# Patient Record
Sex: Female | Born: 1955 | Race: White | Hispanic: No | Smoking: Current every day smoker
Health system: Southern US, Community
[De-identification: ages and names within clinical notes are randomized; demographics above are authoritative.]

## PROBLEM LIST (undated history)

## (undated) DIAGNOSIS — G8929 Other chronic pain: Secondary | ICD-10-CM

## (undated) DIAGNOSIS — M1990 Unspecified osteoarthritis, unspecified site: Secondary | ICD-10-CM

## (undated) DIAGNOSIS — Z8679 Personal history of other diseases of the circulatory system: Secondary | ICD-10-CM

## (undated) DIAGNOSIS — F419 Anxiety disorder, unspecified: Secondary | ICD-10-CM

## (undated) DIAGNOSIS — M549 Dorsalgia, unspecified: Secondary | ICD-10-CM

## (undated) DIAGNOSIS — M25519 Pain in unspecified shoulder: Secondary | ICD-10-CM

## (undated) DIAGNOSIS — K219 Gastro-esophageal reflux disease without esophagitis: Secondary | ICD-10-CM

## (undated) DIAGNOSIS — M25561 Pain in right knee: Secondary | ICD-10-CM

## (undated) DIAGNOSIS — D126 Benign neoplasm of colon, unspecified: Secondary | ICD-10-CM

## (undated) DIAGNOSIS — K2970 Gastritis, unspecified, without bleeding: Secondary | ICD-10-CM

## (undated) DIAGNOSIS — Z87442 Personal history of urinary calculi: Secondary | ICD-10-CM

## (undated) DIAGNOSIS — M25562 Pain in left knee: Secondary | ICD-10-CM

## (undated) DIAGNOSIS — G629 Polyneuropathy, unspecified: Secondary | ICD-10-CM

## (undated) DIAGNOSIS — M75102 Unspecified rotator cuff tear or rupture of left shoulder, not specified as traumatic: Secondary | ICD-10-CM

## (undated) DIAGNOSIS — B9681 Helicobacter pylori [H. pylori] as the cause of diseases classified elsewhere: Secondary | ICD-10-CM

## (undated) HISTORY — PX: ABDOMINAL HYSTERECTOMY: SHX81

## (undated) HISTORY — PX: TUBAL LIGATION: SHX77

## (undated) HISTORY — DX: Anxiety disorder, unspecified: F41.9

---

## 2012-06-08 ENCOUNTER — Encounter (HOSPITAL_COMMUNITY): Payer: Self-pay | Admitting: *Deleted

## 2012-06-08 ENCOUNTER — Emergency Department (HOSPITAL_COMMUNITY)
Admission: EM | Admit: 2012-06-08 | Discharge: 2012-06-08 | Disposition: A | Discharge status: Home / Self Care | Payer: Self-pay | Attending: Emergency Medicine | Admitting: Emergency Medicine

## 2012-06-08 ENCOUNTER — Emergency Department (HOSPITAL_COMMUNITY): Payer: Self-pay

## 2012-06-08 DIAGNOSIS — Y939 Activity, unspecified: Secondary | ICD-10-CM | POA: Insufficient documentation

## 2012-06-08 DIAGNOSIS — S99919A Unspecified injury of unspecified ankle, initial encounter: Secondary | ICD-10-CM | POA: Insufficient documentation

## 2012-06-08 DIAGNOSIS — F17200 Nicotine dependence, unspecified, uncomplicated: Secondary | ICD-10-CM | POA: Insufficient documentation

## 2012-06-08 DIAGNOSIS — Z79899 Other long term (current) drug therapy: Secondary | ICD-10-CM | POA: Insufficient documentation

## 2012-06-08 DIAGNOSIS — W010XXA Fall on same level from slipping, tripping and stumbling without subsequent striking against object, initial encounter: Secondary | ICD-10-CM | POA: Insufficient documentation

## 2012-06-08 DIAGNOSIS — M25562 Pain in left knee: Secondary | ICD-10-CM

## 2012-06-08 DIAGNOSIS — S8990XA Unspecified injury of unspecified lower leg, initial encounter: Secondary | ICD-10-CM | POA: Insufficient documentation

## 2012-06-08 DIAGNOSIS — Y92009 Unspecified place in unspecified non-institutional (private) residence as the place of occurrence of the external cause: Secondary | ICD-10-CM | POA: Insufficient documentation

## 2012-06-08 MED ORDER — NAPROXEN 500 MG PO TABS: 500.0000 mg | ORAL_TABLET | Freq: Two times a day (BID) | ORAL | Status: DC

## 2012-06-08 MED ORDER — HYDROCODONE-ACETAMINOPHEN 5-325 MG PO TABS: ORAL_TABLET | ORAL | Status: DC

## 2012-06-08 MED ORDER — IBUPROFEN 800 MG PO TABS
800.0000 mg | ORAL_TABLET | Freq: Once | ORAL | Status: AC
  Administered 2012-06-08: 800 mg via ORAL
  Filled 2012-06-08: qty 1

## 2012-06-08 MED ORDER — OXYCODONE-ACETAMINOPHEN 5-325 MG PO TABS
1.0000 | ORAL_TABLET | Freq: Once | ORAL | Status: AC
  Administered 2012-06-08: 1 via ORAL
  Filled 2012-06-08: qty 1

## 2012-06-08 NOTE — ED Notes (Signed)
Pt fell on living room floor (carpet) after tripping over mattress on December 30, still continues to have pain to left knee area, knee is swollen, tender to touch, has abrasion (black scab on top) to center of knee. Cms intact distal

## 2012-06-08 NOTE — ED Provider Notes (Signed)
History     CSN: [id number]  Arrival date & time 06/08/12  1534   First MD Initiated Contact with Patient 06/08/12 1740      Chief Complaint  Patient presents with  . Knee Pain    (Consider location/radiation/quality/duration/timing/severity/associated sxs/prior treatment) HPI Comments: Patient complains of pain and swelling to her left knee for 2 weeks. She states the symptoms began after she fell in her home and landed on her knee. She states that her knee began to swell immediately and has persisted and is very painful to touch. She states the pain to her knee is worse with flexion and improves with rest. She denies numbness of the extremity, redness, draining, red streaks, or distal tenderness.  She states that she has not been evaluated for the injury prior to tonight due to to a lack of finances .  Patient is a 57 y.o. female presenting with knee pain. The history is provided by the patient.  Knee Pain This is a new problem. The current episode started 1 to 4 weeks ago. The problem occurs constantly. The problem has been unchanged. Associated symptoms include arthralgias and joint swelling. Pertinent negatives include no chest pain, chills, congestion, fever, headaches, nausea, neck pain, numbness, rash, vomiting or weakness. The symptoms are aggravated by standing, walking, twisting and bending. She has tried nothing for the symptoms. The treatment provided no relief.    History reviewed. No pertinent past medical history.  Past Surgical History  Procedure Date  . Abdominal hysterectomy     No family history on file.  History  Substance Use Topics  . Smoking status: Current Every Day Smoker  . Smokeless tobacco: Not on file  . Alcohol Use: No    OB History    Grav Para Term Preterm Abortions TAB SAB Ect Mult Living                  Review of Systems  Constitutional: Negative for fever and chills.  HENT: Negative for congestion and neck pain.   Cardiovascular:  Negative for chest pain.  Gastrointestinal: Negative for nausea and vomiting.  Genitourinary: Negative for dysuria and difficulty urinating.  Musculoskeletal: Positive for joint swelling and arthralgias. Negative for back pain.  Skin: Positive for wound. Negative for color change and rash.       Abrasion to her knee  Neurological: Negative for weakness, numbness and headaches.  All other systems reviewed and are negative.    Allergies  Penicillins  Home Medications  No current outpatient prescriptions on file.  BP 120/76  Pulse 100  Temp 97.7 F (36.5 C) (Oral)  Resp 20  Ht 5\' 7"  (1.702 m)  Wt 188 lb (85.276 kg)  BMI 29.44 kg/m2  SpO2 99%  Physical Exam  Nursing note and vitals reviewed. Constitutional: She is oriented to person, place, and time. She appears well-developed and well-nourished. No distress.  HENT:  Head: Normocephalic and atraumatic.  Cardiovascular: Normal rate, regular rhythm, normal heart sounds and intact distal pulses.   Pulmonary/Chest: Effort normal and breath sounds normal.  Musculoskeletal: She exhibits edema and tenderness.       Left knee: She exhibits decreased range of motion and swelling. She exhibits no effusion, no ecchymosis, no deformity and no erythema. tenderness found. Medial joint line and lateral joint line tenderness noted.       Legs:      Diffuse tenderness to palpation of the left knee. Mild to moderate soft tissue swelling is present, no palpable effusion.  No step-off deformities, patellar tendon appears intact. Dime -sized scabbed abrasion over the patella without erythema, drainage, or excessive warmth.  No  bruising or bony deformity.  Dp pulse brisk, distal sensation intact.    Neurological: She is alert and oriented to person, place, and time. She exhibits normal muscle tone. Coordination normal.  Skin: Skin is warm and dry. No erythema.    ED Course  Procedures (including critical care time)  Labs Reviewed - No data to  display Dg Knee Complete 4 Views Left  06/08/2012  *RADIOLOGY REPORT*  Clinical Data: Left knee pain and swelling secondary to a fall today.  LEFT KNEE - COMPLETE 4+ VIEW  Comparison: None.  Findings: There is no fracture or dislocation.  There is prominent soft tissue swelling over the patella with slight calcification at the origin of the patellar tendon.  Small joint effusion.  IMPRESSION: Prominent soft tissue swelling anterior to the patellar.  Small joint effusion.   Original Report Authenticated By: Francene Boyers, M.D.         MDM   Diffuse tenderness to palpation and soft tissue swelling of the left knee. No palpable effusion.  Patient is able to extend at the knee joint, but pain is reproduced with flexion. There is a small scabbed abrasion over the patella appears to be healing well. No excessive warmth or erythema of the knee.  Doubt septic joint.  Concerning for internal derangement  Patient requests information regarding financial assistance. Contact information for [employer] was given.  Patient states she has a knee immobilizer and crutches at home. Have advised her to elevate, apply ice, and minimize walking and standing. She agrees to close orthopedic followup or to return here if her symptoms worsen   18:45  Consulted Dr. Romeo Apple.  Will see pt in his office.    Prescribed: Norco #20 naprosyn  Nancy Espinoza, [place] 06/08/12 2212

## 2012-06-13 NOTE — ED Provider Notes (Signed)
Medical screening examination/treatment/procedure(s) were performed by non-physician practitioner and as supervising physician I was immediately available for consultation/collaboration.   Lillianna Sabel M Raisa Ditto, MD 06/13/12 2119 

## 2012-06-26 ENCOUNTER — Telehealth: Payer: Self-pay | Admitting: Orthopedic Surgery

## 2012-06-26 NOTE — Telephone Encounter (Signed)
Appointment ok

## 2012-06-26 NOTE — Telephone Encounter (Signed)
appointment is ok

## 2012-06-26 NOTE — Telephone Encounter (Signed)
Please review ER notes and report for left knee.  This patient has [facility]% [facility] thru 12/22/12 and is asking for an appointment here. Please advise.

## 2012-06-26 NOTE — Telephone Encounter (Signed)
Appointment scheduled for 07/17/12, patient aware

## 2012-07-17 ENCOUNTER — Ambulatory Visit: Payer: Self-pay | Admitting: Orthopedic Surgery

## 2012-07-23 ENCOUNTER — Ambulatory Visit (INDEPENDENT_AMBULATORY_CARE_PROVIDER_SITE_OTHER): Payer: Self-pay | Admitting: Orthopedic Surgery

## 2012-07-23 ENCOUNTER — Encounter: Payer: Self-pay | Admitting: Orthopedic Surgery

## 2012-07-23 VITALS — BP 112/68 | Ht 67.0 in | Wt 194.0 lb

## 2012-07-23 DIAGNOSIS — T148XXA Other injury of unspecified body region, initial encounter: Secondary | ICD-10-CM | POA: Insufficient documentation

## 2012-07-23 NOTE — Progress Notes (Signed)
Patient ID: Nancy Espinoza, female   DOB: [birth year], 57 y.o.   MRN: [id number] Chief Complaint  Patient presents with  . Knee Pain    severe pain in left knee d/t injury 05/26/12    History  The patient was injured on 05/26/2012. She denies of severe left knee pain when she stands and gets up. She fell on her right knee had drainage from the knee secondary to an abrasion. She complains of sharp stabbing 8/10 constant pain which is associated with numbness tingling and locking of the knee. She says everything makes it worse nothing makes it better she has not had any other treatment. She did have an x-ray was normal I reviewed  She reported her review of systems as heartburn anxiety and seasonal allergies remaining systems were negative  Her medical history is as follows  History reviewed. No pertinent past medical history.  BP 112/68  Ht 5\' 7"  (1.702 m)  Wt 194 lb (87.998 kg)  BMI 30.38 kg/m2 Semination findings include general appearance was normal she was oriented x3 her mood and affect were normal her ambulation was normal. She did have an abrasion over the front of her knee medially just between the patella and femur no joint effusion. She says she can't flex the knee past 100 her knee comes to full extension is stable strength is normal skin is intact pulses good lymph nodes are negative sensation is normal her balance is good and there are no pathologic reflexes  X-rays were negative  Medical decision making includes the following data imaging interpretation with reports negative x-ray Diagnosis new problem further workup planned Risk MRI ordered  I think the patient is a bone contusion recommend MRI to rule out bone contusion  Patient take ibuprofen for pain she's had physical therapy to improve range of motion and strength  She's not need any narcotic pain medication for this  I will call her with the results. If there are positive results we will of course treat  that  Impression bone contusion left knee MRI scheduled

## 2012-07-28 ENCOUNTER — Telehealth: Payer: Self-pay | Admitting: Radiology

## 2012-07-28 NOTE — Telephone Encounter (Signed)
Patient has MRI at [facility] on 07-31-12 at 11:45. Patient has the [facility]. She will follow up back here in the office for her results.

## 2012-07-29 ENCOUNTER — Telehealth: Payer: Self-pay | Admitting: Orthopedic Surgery

## 2012-07-29 NOTE — Telephone Encounter (Signed)
Nancy Espinoza said the Ibuprofen is not helping her knee pain, she is asking if you can call in a stronger pain medication to [facility] in [place].

## 2012-07-29 NOTE — Telephone Encounter (Signed)
Advised  patient of doctor's reply °

## 2012-07-29 NOTE — Telephone Encounter (Signed)
No i can not

## 2012-07-30 ENCOUNTER — Telehealth: Payer: Self-pay | Admitting: Orthopedic Surgery

## 2012-07-30 NOTE — Telephone Encounter (Signed)
Patient called about (1) MRI - I gave the appointment information to her - scheduled tomorrow, 07/31/12, 11:45am, [facility].  States she may need to request a later time - she will call directly if needs to re-schedule.  Last office note states that Dr. Romeo Apple will call patient with results.                                  (2) Physical therapy appointment - patient states she was given appointment at [facility] for 08/07/12.  Asking if okay to start therapy if she needs to have MRI at a later date.  Her ph# is [phone].

## 2012-07-31 ENCOUNTER — Ambulatory Visit (HOSPITAL_COMMUNITY): Payer: Self-pay

## 2012-08-04 ENCOUNTER — Ambulatory Visit: Payer: Self-pay | Admitting: Orthopedic Surgery

## 2012-08-04 ENCOUNTER — Ambulatory Visit (HOSPITAL_COMMUNITY)
Admission: RE | Admit: 2012-08-04 | Discharge: 2012-08-04 | Disposition: A | Discharge status: Home / Self Care | Payer: No Typology Code available for payment source | Source: Ambulatory Visit | Attending: Orthopedic Surgery | Admitting: Orthopedic Surgery

## 2012-08-04 DIAGNOSIS — T148XXA Other injury of unspecified body region, initial encounter: Secondary | ICD-10-CM

## 2012-08-05 ENCOUNTER — Ambulatory Visit (HOSPITAL_COMMUNITY)
Admission: RE | Admit: 2012-08-05 | Discharge: 2012-08-05 | Disposition: A | Discharge status: Home / Self Care | Payer: No Typology Code available for payment source | Source: Ambulatory Visit | Attending: Orthopedic Surgery | Admitting: Orthopedic Surgery

## 2012-08-05 DIAGNOSIS — W19XXXA Unspecified fall, initial encounter: Secondary | ICD-10-CM | POA: Insufficient documentation

## 2012-08-05 DIAGNOSIS — S99929A Unspecified injury of unspecified foot, initial encounter: Secondary | ICD-10-CM | POA: Insufficient documentation

## 2012-08-05 DIAGNOSIS — M25469 Effusion, unspecified knee: Secondary | ICD-10-CM | POA: Insufficient documentation

## 2012-08-05 DIAGNOSIS — S8990XA Unspecified injury of unspecified lower leg, initial encounter: Secondary | ICD-10-CM | POA: Insufficient documentation

## 2012-08-05 DIAGNOSIS — M25569 Pain in unspecified knee: Secondary | ICD-10-CM | POA: Insufficient documentation

## 2012-08-07 ENCOUNTER — Ambulatory Visit (INDEPENDENT_AMBULATORY_CARE_PROVIDER_SITE_OTHER): Payer: No Typology Code available for payment source | Admitting: Orthopedic Surgery

## 2012-08-07 ENCOUNTER — Ambulatory Visit (HOSPITAL_COMMUNITY): Payer: Self-pay | Admitting: Physical Therapy

## 2012-08-07 VITALS — Ht 67.0 in

## 2012-08-07 DIAGNOSIS — M1710 Unilateral primary osteoarthritis, unspecified knee: Secondary | ICD-10-CM

## 2012-08-07 MED ORDER — DICLOFENAC POTASSIUM 50 MG PO TABS: 50.0000 mg | ORAL_TABLET | Freq: Two times a day (BID) | ORAL | Status: DC

## 2012-08-07 MED ORDER — HYDROCODONE-ACETAMINOPHEN 5-325 MG PO TABS: 1.0000 | ORAL_TABLET | Freq: Four times a day (QID) | ORAL | Status: DC | PRN

## 2012-08-07 NOTE — Patient Instructions (Addendum)
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 Medication and injection  2. Arthroscopic debridement 60% chance of improvement  (3. Knee replacement but its too soon to do that.)

## 2012-08-07 NOTE — Progress Notes (Signed)
Patient ID: Nancy Espinoza, female   DOB: 10/18/1955, 57 y.o.   MRN: [id number] Chief Complaint  Patient presents with  . Follow-up    MRI results of left knee.   History  The patient comes back in for results of her MRI basically shows she has an arthritic knee cap she has mild degenerative changes in the medial lateral compartment but not enough to warrant knee replacement surgery  We discussed this at length options are medication, arthroscopic debridement or knee replacement 5 years  She's decided on medication so she'll continue on hydrocodone 5 mg and diclofenac 50 mg all see her in 3 months

## 2012-08-25 ENCOUNTER — Telehealth: Payer: Self-pay | Admitting: Orthopedic Surgery

## 2012-08-25 NOTE — Telephone Encounter (Signed)
Nancy Espinoza said her knee is no better, she wants to go ahead and schedule   surgery as discussed at last office visit.  She also confirmed that she still has the [facility] discount.  Her # [phone]  Or [phone]

## 2012-08-29 NOTE — Telephone Encounter (Signed)
Ill call her and discuss anesthesia cost and connect her to dr Jayme Cloud to arrange payment

## 2012-09-16 ENCOUNTER — Telehealth: Payer: Self-pay | Admitting: Orthopedic Surgery

## 2012-09-16 NOTE — Telephone Encounter (Signed)
Nancy Espinoza says her knee has started giving away, and she is asking for another appointment.  In her last phone note, you were going to call her to discuss The anesthesia cost with Dr. Jayme Cloud.  Please advise if to schedule her back here or if you are going to call her first. Her # is [phone]

## 2012-09-16 NOTE — Telephone Encounter (Signed)
Do not schedule because until I speak with Dr. Jayme Cloud there is absolutely nothing else I can do

## 2012-09-17 NOTE — Telephone Encounter (Signed)
Left a message for Nancy Espinoza to call us back

## 2012-09-17 NOTE — Telephone Encounter (Signed)
Left a message for Nancy Espinoza to call us back °

## 2012-09-17 NOTE — Telephone Encounter (Signed)
Have her call Dr Jayme Cloud and or his billing company and discuss payment for the anesthetic

## 2012-09-18 NOTE — Telephone Encounter (Signed)
Nancy Espinoza called back,relayed that she needs to work with [name] and Dr. Jayme Cloud about the anesthesia cost before any surgery is scheduled.

## 2012-11-06 ENCOUNTER — Ambulatory Visit (INDEPENDENT_AMBULATORY_CARE_PROVIDER_SITE_OTHER): Payer: No Typology Code available for payment source | Admitting: Orthopedic Surgery

## 2012-11-06 ENCOUNTER — Encounter: Payer: Self-pay | Admitting: Orthopedic Surgery

## 2012-11-06 VITALS — BP 90/60 | Ht 67.0 in | Wt 194.0 lb

## 2012-11-06 DIAGNOSIS — M1710 Unilateral primary osteoarthritis, unspecified knee: Secondary | ICD-10-CM

## 2012-11-06 MED ORDER — HYDROCODONE-ACETAMINOPHEN 5-325 MG PO TABS: 1.0000 | ORAL_TABLET | Freq: Four times a day (QID) | ORAL | Status: DC | PRN

## 2012-11-06 NOTE — Patient Instructions (Signed)
You have received a steroid shot. 15% of patients experience increased pain at the injection site with in the next 24 hours. This is best treated with ice and tylenol extra strength 2 tabs every 8 hours. If you are still having pain please call the office.  Continue cataflam and norco 5 mg

## 2012-11-06 NOTE — Progress Notes (Signed)
Patient ID: Nancy Espinoza, female   DOB: 03-Jul-1955, 57 y.o.   MRN: [id number] Chief Complaint  Patient presents with  . Follow-up    3 month recheck left knee   History  The patient comes back in for results of her MRI basically shows she has an arthritic knee cap she has mild degenerative changes in the medial lateral compartment but not enough to warrant knee replacement surgery  We discussed this at length options are medication, arthroscopic debridement or knee replacement 5 years  She's decided on medication so she'll continue on hydrocodone 5 mg and diclofenac 50 mg all see her in 3 months This is the last noted above  She still complains of pain in her knee says it hurts and is severe  She went increase her hydrocodone but I declined she doesn't take diclofenac anymore she takes Naprosyn because she didn't get it over-the-counter  She does not have insurance cannot get prescription drugs on a routine basis  I still think she has arthritis and some her symptoms are discharge saturated but in any event she does have some degenerative change on MRI especially in the patellar network primary complaint is  She is oriented x3 her mood and affect is normal she has stable vital signs BP 90/60  Ht 5\' 7"  (1.702 m)  Wt 194 lb (87.998 kg)  BMI 30.38 kg/m2 She has painful crepitus in the patellofemoral joint medial and lateral joint lines nontender knee is stable strength is normal skin is intact there is no joint effusion  Impression osteoarthritis  Continue Naprosyn and Norco for pain followup with [place] 6 months  We decided to give her an injection because she cannot have arthroscopic or total knee surgery  Inject left knee Knee  Injection Procedure Note  Pre-operative Diagnosis: left knee oa  Post-operative Diagnosis: same  Indications: pain  Anesthesia: ethyl chloride   Procedure Details   Verbal consent was obtained for the procedure. Time out was completed.The joint was  prepped with alcohol, followed by  Ethyl chloride spray and A 20 gauge needle was inserted into the knee via lateral approach; 4ml 1% lidocaine and 1 ml of depomedrol  was then injected into the joint . The needle was removed and the area cleansed and dressed.  Complications:  None; patient tolerated the procedure well.

## 2012-12-23 ENCOUNTER — Telehealth: Payer: Self-pay | Admitting: Orthopedic Surgery

## 2012-12-23 NOTE — Telephone Encounter (Signed)
INJECTIONS 3 MOS APART

## 2012-12-23 NOTE — Telephone Encounter (Signed)
Patient called to ask if she can have another knee injection - states had also discussed having knee surgery, however, she states no insurance as of yet.  Would another injection help her left knee? She last had injection 11/06/12. Please advise.  Ph # [phone].

## 2012-12-24 NOTE — Telephone Encounter (Signed)
Called back to patient, offered appointment for injection.

## 2013-01-12 ENCOUNTER — Other Ambulatory Visit: Payer: Self-pay | Admitting: *Deleted

## 2013-01-12 DIAGNOSIS — M1710 Unilateral primary osteoarthritis, unspecified knee: Secondary | ICD-10-CM

## 2013-01-12 MED ORDER — HYDROCODONE-ACETAMINOPHEN 5-325 MG PO TABS: 1.0000 | ORAL_TABLET | Freq: Four times a day (QID) | ORAL | Status: DC | PRN

## 2013-03-04 ENCOUNTER — Telehealth: Payer: Self-pay | Admitting: Orthopedic Surgery

## 2013-03-04 ENCOUNTER — Other Ambulatory Visit: Payer: Self-pay | Admitting: Orthopedic Surgery

## 2013-03-04 DIAGNOSIS — M1710 Unilateral primary osteoarthritis, unspecified knee: Secondary | ICD-10-CM

## 2013-03-04 MED ORDER — DICLOFENAC POTASSIUM 50 MG PO TABS: 50.0000 mg | ORAL_TABLET | Freq: Two times a day (BID) | ORAL | Status: DC

## 2013-03-04 MED ORDER — HYDROCODONE-ACETAMINOPHEN 5-325 MG PO TABS: 1.0000 | ORAL_TABLET | Freq: Four times a day (QID) | ORAL | Status: DC | PRN

## 2013-03-04 NOTE — Telephone Encounter (Signed)
Patient has picked up prescription.

## 2013-03-04 NOTE — Telephone Encounter (Signed)
Nancy Espinoza wants a prescription for Hydrocodone.  Her # [phone]

## 2013-03-04 NOTE — Telephone Encounter (Signed)
PICKUP

## 2013-03-31 ENCOUNTER — Other Ambulatory Visit: Payer: Self-pay | Admitting: *Deleted

## 2013-03-31 ENCOUNTER — Telehealth: Payer: Self-pay | Admitting: Orthopedic Surgery

## 2013-03-31 NOTE — Telephone Encounter (Signed)
refill when its do   Right?

## 2013-03-31 NOTE — Telephone Encounter (Signed)
Nancy Espinoza wants a prescription for Hydrocodone.  States "may not be due until 04/05/13." [name] will be in [place], [place].  Her # [phone].  Patient is due for appointment 05/07/13. Please advise patient.

## 2013-03-31 NOTE — Telephone Encounter (Signed)
Routing to Dr Harrison 

## 2013-04-01 NOTE — Telephone Encounter (Signed)
Tammy, LPN has message from Dr. Romeo Apple and is following up with patient.

## 2013-04-02 ENCOUNTER — Other Ambulatory Visit: Payer: Self-pay | Admitting: *Deleted

## 2013-04-02 DIAGNOSIS — M1710 Unilateral primary osteoarthritis, unspecified knee: Secondary | ICD-10-CM

## 2013-04-02 MED ORDER — HYDROCODONE-ACETAMINOPHEN 5-325 MG PO TABS: 1.0000 | ORAL_TABLET | Freq: Four times a day (QID) | ORAL | Status: DC | PRN

## 2013-04-03 NOTE — Telephone Encounter (Signed)
Patient picked up prescription Friday 04/03/13

## 2013-04-30 ENCOUNTER — Other Ambulatory Visit: Payer: Self-pay | Admitting: *Deleted

## 2013-04-30 ENCOUNTER — Telehealth: Payer: Self-pay | Admitting: Orthopedic Surgery

## 2013-04-30 DIAGNOSIS — M1712 Unilateral primary osteoarthritis, left knee: Secondary | ICD-10-CM

## 2013-04-30 MED ORDER — IBUPROFEN 800 MG PO TABS: 800.0000 mg | ORAL_TABLET | Freq: Three times a day (TID) | ORAL | Status: DC

## 2013-04-30 NOTE — Telephone Encounter (Signed)
Nancy Espinoza says her primary care doctor has given her Ibuprofen 800mg  and it does not touch her pain.  Asked if you will reconsider and give her a prescription for Hydrocodone  Her # [phone]

## 2013-04-30 NOTE — Telephone Encounter (Signed)
No   She should have her primary care doctor address her pain concerns or see pain management

## 2013-04-30 NOTE — Telephone Encounter (Signed)
Switch patient to ibuprofen 800 mg 3 times a day

## 2013-04-30 NOTE — Telephone Encounter (Signed)
Advised the patient of doctor's reply °

## 2013-04-30 NOTE — Telephone Encounter (Signed)
Chellie Vanlue wants a prescription for Hydrocodone

## 2013-05-07 ENCOUNTER — Ambulatory Visit: Payer: Self-pay | Admitting: Orthopedic Surgery

## 2013-11-14 ENCOUNTER — Emergency Department (HOSPITAL_COMMUNITY)
Admission: EM | Admit: 2013-11-14 | Discharge: 2013-11-14 | Disposition: A | Discharge status: Home / Self Care | Payer: Disability Insurance | Attending: Emergency Medicine | Admitting: Emergency Medicine

## 2013-11-14 ENCOUNTER — Encounter (HOSPITAL_COMMUNITY): Payer: Self-pay | Admitting: Emergency Medicine

## 2013-11-14 DIAGNOSIS — F17200 Nicotine dependence, unspecified, uncomplicated: Secondary | ICD-10-CM | POA: Insufficient documentation

## 2013-11-14 DIAGNOSIS — M25569 Pain in unspecified knee: Secondary | ICD-10-CM | POA: Insufficient documentation

## 2013-11-14 DIAGNOSIS — M25511 Pain in right shoulder: Secondary | ICD-10-CM

## 2013-11-14 DIAGNOSIS — Z79899 Other long term (current) drug therapy: Secondary | ICD-10-CM | POA: Insufficient documentation

## 2013-11-14 HISTORY — DX: Pain in right knee: M25.562

## 2013-11-14 HISTORY — DX: Dorsalgia, unspecified: M54.9

## 2013-11-14 HISTORY — DX: Pain in right knee: M25.561

## 2013-11-14 MED ORDER — INDOMETHACIN 25 MG PO CAPS: 25.0000 mg | ORAL_CAPSULE | Freq: Three times a day (TID) | ORAL | Status: DC | PRN

## 2013-11-14 MED ORDER — DIAZEPAM 5 MG PO TABS
5.0000 mg | ORAL_TABLET | Freq: Once | ORAL | Status: AC
  Administered 2013-11-14: 5 mg via ORAL
  Filled 2013-11-14: qty 1

## 2013-11-14 MED ORDER — INDOMETHACIN 25 MG PO CAPS
25.0000 mg | ORAL_CAPSULE | Freq: Once | ORAL | Status: AC
  Administered 2013-11-14: 25 mg via ORAL
  Filled 2013-11-14: qty 1

## 2013-11-14 MED ORDER — DIAZEPAM 5 MG PO TABS: ORAL_TABLET | ORAL | Status: DC

## 2013-11-14 MED ORDER — DEXAMETHASONE 4 MG PO TABS: ORAL_TABLET | ORAL | Status: DC

## 2013-11-14 MED ORDER — PREDNISONE 10 MG PO TABS
60.0000 mg | ORAL_TABLET | Freq: Once | ORAL | Status: AC
  Administered 2013-11-14: 60 mg via ORAL
  Filled 2013-11-14 (×2): qty 1

## 2013-11-14 NOTE — Discharge Instructions (Signed)
Please use your sling. Please see the orthopedic specialist listed above or the orthopedic specialist of your choice as soon as possible for evaluation of your shoulder. No evidence for emergent problems at this time. Use medications as suggested. Shoulder Pain The shoulder is the joint that connects your arms to your body. The bones that form the shoulder joint include the upper arm bone (humerus), the shoulder blade (scapula), and the collarbone (clavicle). The top of the humerus is shaped like a ball and fits into a rather flat socket on the scapula (glenoid cavity). A combination of muscles and strong, fibrous tissues that connect muscles to bones (tendons) support your shoulder joint and hold the ball in the socket. Small, fluid-filled sacs (bursae) are located in different areas of the joint. They act as cushions between the bones and the overlying soft tissues and help reduce friction between the gliding tendons and the bone as you move your arm. Your shoulder joint allows a wide range of motion in your arm. This range of motion allows you to do things like scratch your back or throw a ball. However, this range of motion also makes your shoulder more prone to pain from overuse and injury. Causes of shoulder pain can originate from both injury and overuse and usually can be grouped in the following four categories:  Redness, swelling, and pain (inflammation) of the tendon (tendinitis) or the bursae (bursitis).  Instability, such as a dislocation of the joint.  Inflammation of the joint (arthritis).  Broken bone (fracture). HOME CARE INSTRUCTIONS   Apply ice to the sore area.  Put ice in a plastic bag.  Place a towel between your skin and the bag.  Leave the ice on for 15-20 minutes, 3-4 times per day for the first 2 days, or as directed by your health care provider.  Stop using cold packs if they do not help with the pain.  If you have a shoulder sling or immobilizer, wear it as long as  your caregiver instructs. Only remove it to shower or bathe. Move your arm as little as possible, but keep your hand moving to prevent swelling.  Squeeze a soft ball or foam pad as much as possible to help prevent swelling.  Only take over-the-counter or prescription medicines for pain, discomfort, or fever as directed by your caregiver. SEEK MEDICAL CARE IF:   Your shoulder pain increases, or new pain develops in your arm, hand, or fingers.  Your hand or fingers become cold and numb.  Your pain is not relieved with medicines. SEEK IMMEDIATE MEDICAL CARE IF:   Your arm, hand, or fingers are numb or tingling.  Your arm, hand, or fingers are significantly swollen or turn white or blue. MAKE SURE YOU:   Understand these instructions.  Will watch your condition.  Will get help right away if you are not doing well or get worse. Document Released: 02/21/2005 Document Revised: 05/19/2013 Document Reviewed: 04/28/2011 [facility] Patient Information 2015 [place], [place]. This information is not intended to replace advice given to you by your health care provider. Make sure you discuss any questions you have with your health care provider.

## 2013-11-14 NOTE — ED Provider Notes (Signed)
Medical screening examination/treatment/procedure(s) were performed by non-physician practitioner and as supervising physician I was immediately available for consultation/collaboration.   EKG Interpretation None        Maudry Diego, MD 11/14/13 1755

## 2013-11-14 NOTE — ED Provider Notes (Signed)
CSN: [id number]     Arrival date & time 11/14/13  1534 History   First MD Initiated Contact with Patient 11/14/13 1607     Chief Complaint  Patient presents with  . Arm Pain     (Consider location/radiation/quality/duration/timing/severity/associated sxs/prior Treatment) Patient is a 57 y.o. female presenting with arm pain. The history is provided by the patient.  Arm Pain This is a chronic problem. The current episode started more than 1 month ago. The problem occurs intermittently. The problem has been gradually worsening. Associated symptoms include arthralgias. Pertinent negatives include no abdominal pain, chest pain, coughing, joint swelling, neck pain or numbness. Exacerbated by: pain with movement. She has tried heat and NSAIDs for the symptoms. The treatment provided no relief.    Past Medical History  Diagnosis Date  . Back pain   . Knee pain, bilateral    Past Surgical History  Procedure Laterality Date  . Abdominal hysterectomy     History reviewed. No pertinent family history. History  Substance Use Topics  . Smoking status: Current Every Day Smoker    Types: Cigarettes  . Smokeless tobacco: Not on file  . Alcohol Use: 0.6 oz/week    1 Cans of beer per week     Comment: occ   OB History   Grav Para Term Preterm Abortions TAB SAB Ect Mult Living                 Review of Systems  Constitutional: Negative for activity change.       All ROS Neg except as noted in HPI  HENT: Negative for nosebleeds.   Eyes: Negative for photophobia and discharge.  Respiratory: Negative for cough, shortness of breath and wheezing.   Cardiovascular: Negative for chest pain and palpitations.  Gastrointestinal: Negative for abdominal pain and blood in stool.  Genitourinary: Negative for dysuria, frequency and hematuria.  Musculoskeletal: Positive for arthralgias and back pain. Negative for joint swelling and neck pain.  Skin: Negative.   Neurological: Negative for dizziness,  seizures, speech difficulty and numbness.  Psychiatric/Behavioral: Negative for hallucinations and confusion.      Allergies  Codeine and Penicillins  Home Medications   Prior to Admission medications   Medication Sig Start Date End Date Taking? Authorizing Provider  diclofenac (CATAFLAM) 50 MG tablet Take 1 tablet (50 mg total) by mouth 2 (two) times daily. 03/04/13   Carole Civil, MD  hydrochlorothiazide (HYDRODIURIL) 25 MG tablet Take 12.5 mg by mouth daily.    Historical Provider, MD  HYDROcodone-acetaminophen (NORCO/VICODIN) 5-325 MG per tablet Take 1 tablet by mouth every 6 (six) hours as needed. 04/02/13   Carole Civil, MD  ibuprofen (ADVIL,MOTRIN) 800 MG tablet Take 1 tablet (800 mg total) by mouth 3 (three) times daily. 04/30/13   Carole Civil, MD  naproxen (NAPROSYN) 500 MG tablet Take 1 tablet (500 mg total) by mouth 2 (two) times daily with a meal. 06/08/12   Tammy L. Triplett, PA-C  PARoxetine (PAXIL) 20 MG tablet Take 20 mg by mouth daily.    Historical Provider, MD  valsartan (DIOVAN) 160 MG tablet Take 160 mg by mouth daily.    Historical Provider, MD   BP 134/86  Pulse 80  Temp(Src) 97.8 F (36.6 C) (Oral)  Resp 19  SpO2 94% Physical Exam  Nursing note and vitals reviewed. Constitutional: She is oriented to person, place, and time. She appears well-developed and well-nourished.  Non-toxic appearance.  HENT:  Head: Normocephalic.  Right Ear: Tympanic  membrane and external ear normal.  Left Ear: Tympanic membrane and external ear normal.  Eyes: EOM and lids are normal. Pupils are equal, round, and reactive to light.  Neck: Normal range of motion. Neck supple. Carotid bruit is not present.  Cardiovascular: Normal rate, regular rhythm, normal heart sounds, intact distal pulses and normal pulses.   Pulmonary/Chest: Breath sounds normal. No respiratory distress.  Abdominal: Soft. Bowel sounds are normal. There is no tenderness. There is no guarding.   Musculoskeletal: Normal range of motion.  There is pain to the anterior right shoulder and pain to the inner aspect of the bicep and tricep area. FROM of the wrist and fingers. No hot joints.  Lymphadenopathy:       Head (right side): No submandibular adenopathy present.       Head (left side): No submandibular adenopathy present.    She has no cervical adenopathy.  Neurological: She is alert and oriented to person, place, and time. She has normal strength. No cranial nerve deficit or sensory deficit.  Skin: Skin is warm and dry.  Psychiatric: She has a normal mood and affect. Her speech is normal.    ED Course  Procedures (including critical care time) Labs Review Labs Reviewed - No data to display  Imaging Review No results found.   EKG Interpretation None      MDM No acute problem noted at this time. No neuro deficit. Pt to see orthopedics. Use the sling until seen by orthopedics. Rx for valium, indocin, and decadron.   Final diagnoses:  None    **I have reviewed nursing notes, vital signs, and all appropriate lab and imaging results for this patient.Lenox Ahr, PA-C 11/14/13 1712
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
## 2014-01-14 ENCOUNTER — Ambulatory Visit (HOSPITAL_COMMUNITY)
Admission: RE | Admit: 2014-01-14 | Discharge: 2014-01-14 | Disposition: A | Discharge status: Home / Self Care | Payer: Disability Insurance | Source: Ambulatory Visit | Attending: Family Medicine | Admitting: Family Medicine

## 2014-01-14 ENCOUNTER — Other Ambulatory Visit (HOSPITAL_COMMUNITY): Payer: Self-pay | Admitting: Family Medicine

## 2014-01-14 DIAGNOSIS — IMO0002 Reserved for concepts with insufficient information to code with codable children: Secondary | ICD-10-CM | POA: Insufficient documentation

## 2014-01-14 DIAGNOSIS — M1710 Unilateral primary osteoarthritis, unspecified knee: Secondary | ICD-10-CM | POA: Insufficient documentation

## 2014-01-14 DIAGNOSIS — M545 Low back pain, unspecified: Secondary | ICD-10-CM | POA: Diagnosis present

## 2014-01-14 DIAGNOSIS — M25562 Pain in left knee: Secondary | ICD-10-CM

## 2014-01-14 DIAGNOSIS — M25561 Pain in right knee: Secondary | ICD-10-CM

## 2014-03-18 ENCOUNTER — Telehealth: Payer: Self-pay

## 2014-03-18 NOTE — Telephone Encounter (Signed)
PATIENT CALLED TO SCHEDULE A COLONOSCOPY   PLEASE CALL HER BACK

## 2014-03-24 NOTE — Telephone Encounter (Signed)
Phone number on file will not work

## 2014-03-25 ENCOUNTER — Ambulatory Visit: Payer: Disability Insurance | Admitting: Orthopedic Surgery

## 2014-04-07 NOTE — Telephone Encounter (Signed)
Candy tried to call and busy signal. Mailing a letter to call.

## 2014-04-09 ENCOUNTER — Telehealth: Payer: Self-pay

## 2014-04-12 NOTE — Telephone Encounter (Signed)
Letter was mailed to pt on 04/07/2014.

## 2014-04-13 ENCOUNTER — Other Ambulatory Visit: Payer: Self-pay

## 2014-04-13 ENCOUNTER — Telehealth: Payer: Self-pay

## 2014-04-13 DIAGNOSIS — Z1211 Encounter for screening for malignant neoplasm of colon: Secondary | ICD-10-CM

## 2014-04-13 NOTE — Telephone Encounter (Signed)
Patient called to schedule colonoscopy.

## 2014-04-13 NOTE — Telephone Encounter (Signed)
Gastroenterology Pre-Procedure Review  Request Date: 04/13/2014 Requesting Physician: Marjorie Smolder  PATIENT REVIEW QUESTIONS: The patient responded to the following health history questions as indicated:    1. Diabetes Melitis: no 2. Joint replacements in the past 12 months: no 3. Major health problems in the past 3 months: no 4. Has an artificial valve or MVP: no 5. Has a defibrillator: no 6. Has been advised in past to take antibiotics in advance of a procedure like teeth cleaning: no    MEDICATIONS & ALLERGIES:    Patient reports the following regarding taking any blood thinners:   Plavix? no Aspirin? no Coumadin? no  Patient confirms/reports the following medications:  Current Outpatient Prescriptions  Medication Sig Dispense Refill  . ibuprofen (ADVIL,MOTRIN) 800 MG tablet Take 1 tablet (800 mg total) by mouth 3 (three) times daily. 90 tablet 5  . PARoxetine (PAXIL) 20 MG tablet Take 40 mg by mouth daily.     . clonazePAM (KLONOPIN) 1 MG tablet Take 1 mg by mouth 2 (two) times daily.    Marland Kitchen dexamethasone (DECADRON) 4 MG tablet 1 po bid with food 12 tablet 0  . diazepam (VALIUM) 5 MG tablet 1 po tid 15 tablet 0  . diclofenac (CATAFLAM) 50 MG tablet Take 1 tablet (50 mg total) by mouth 2 (two) times daily. 90 tablet 3  . hydrochlorothiazide (HYDRODIURIL) 25 MG tablet Take 12.5 mg by mouth daily.    Marland Kitchen HYDROcodone-acetaminophen (NORCO/VICODIN) 5-325 MG per tablet Take 1 tablet by mouth every 6 (six) hours as needed. 120 tablet 0  . indomethacin (INDOCIN) 25 MG capsule Take 1 capsule (25 mg total) by mouth 3 (three) times daily as needed. 21 capsule 0  . naproxen (NAPROSYN) 500 MG tablet Take 1 tablet (500 mg total) by mouth 2 (two) times daily with a meal. 20 tablet 0  . valsartan (DIOVAN) 160 MG tablet Take 160 mg by mouth daily.     No current facility-administered medications for this visit.    Patient confirms/reports the following allergies:  Allergies  Allergen  Reactions  . Codeine   . Penicillins     No orders of the defined types were placed in this encounter.    AUTHORIZATION INFORMATION Primary Insurance:   ID #:   Group #:  Pre-Cert / Auth required: Pre-Cert / Auth #:   Secondary Insurance:   ID #:   Group #:  Pre-Cert / Auth required:  Pre-Cert / Auth #:   SCHEDULE INFORMATION: Procedure has been scheduled as follows:  Date: 05/10/2014             Time: 11:30 AM Location: [facility] Short Stay  This Gastroenterology Pre-Precedure Review Form is being routed to the following provider(s): Barney Drain, MD

## 2014-04-13 NOTE — Telephone Encounter (Signed)
See separate triage.  

## 2014-04-14 ENCOUNTER — Telehealth: Payer: Self-pay | Admitting: Gastroenterology

## 2014-04-14 NOTE — Telephone Encounter (Signed)
See note of 04/13/2014.

## 2014-04-14 NOTE — Telephone Encounter (Signed)
PT NEEDS OPV PRIOR TO TCS. SHE NEEDS PROPOFOL.

## 2014-04-14 NOTE — Telephone Encounter (Signed)
Pt said she was returning a missed call. Please call [phone]

## 2014-04-14 NOTE — Telephone Encounter (Signed)
Pt is aware TCS has been cancelled and OV for 06/01/2014 at 9:30 Am with Neil Crouch, PA.

## 2014-04-14 NOTE — Telephone Encounter (Signed)
LMOM for pt to call and cancel the appt on 05/10/2014 for colonoscopy. Took off of our schedule and LMOM for Hoyle Sauer to cancel orders.

## 2014-04-15 ENCOUNTER — Encounter: Payer: Self-pay | Admitting: Orthopedic Surgery

## 2014-04-15 ENCOUNTER — Ambulatory Visit (INDEPENDENT_AMBULATORY_CARE_PROVIDER_SITE_OTHER): Payer: Self-pay | Admitting: Orthopedic Surgery

## 2014-04-15 VITALS — BP 126/82 | Ht 67.0 in | Wt 177.0 lb

## 2014-04-15 DIAGNOSIS — M75101 Unspecified rotator cuff tear or rupture of right shoulder, not specified as traumatic: Secondary | ICD-10-CM

## 2014-04-15 MED ORDER — HYDROCODONE-ACETAMINOPHEN 5-325 MG PO TABS: 1.0000 | ORAL_TABLET | Freq: Four times a day (QID) | ORAL | Status: DC | PRN
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
## 2014-04-15 NOTE — Patient Instructions (Signed)
Call to arrange therapy at [facility] 

## 2014-04-15 NOTE — Progress Notes (Signed)
Procedure note the subacromial injection shoulder RIGHT   Verbal consent was obtained to inject the  RIGHT  Shoulder  Timeout was completed to confirm the injection site is a subacromial space of the  RIGHT shoulder   Medication used Depo-Medrol 40 mg and lidocaine 1% 3 cc  Anesthesia was provided by ethyl chloride  The injection was performed in the RIGHT posterior subacromial space. After pinning the skin with alcohol and anesthetized the skin with ethyl chloride the subacromial space was injected using a 20-gauge needle. There were no complications  Sterile dressing was applied.

## 2014-04-19 ENCOUNTER — Encounter (HOSPITAL_COMMUNITY): Payer: Self-pay

## 2014-04-19 ENCOUNTER — Ambulatory Visit (HOSPITAL_COMMUNITY)
Admission: RE | Admit: 2014-04-19 | Discharge: 2014-04-19 | Disposition: A | Discharge status: Home / Self Care | Payer: Disability Insurance | Source: Ambulatory Visit | Attending: Orthopedic Surgery | Admitting: Orthopedic Surgery

## 2014-04-19 DIAGNOSIS — M25511 Pain in right shoulder: Secondary | ICD-10-CM | POA: Diagnosis not present

## 2014-04-19 DIAGNOSIS — Z5189 Encounter for other specified aftercare: Secondary | ICD-10-CM | POA: Diagnosis not present

## 2014-04-19 DIAGNOSIS — M25611 Stiffness of right shoulder, not elsewhere classified: Secondary | ICD-10-CM | POA: Diagnosis not present

## 2014-04-19 DIAGNOSIS — R531 Weakness: Secondary | ICD-10-CM

## 2014-04-19 DIAGNOSIS — M6281 Muscle weakness (generalized): Secondary | ICD-10-CM | POA: Insufficient documentation

## 2014-04-19 NOTE — Patient Instructions (Signed)
SHOULDER: Flexion On Table   Place hands on table, elbows straight. Move hips away from body. Press hands down into table. Hold __5_ seconds. __10_ reps per set, __1_ sets per day, __7_ days per week  Abduction (Passive)   With arm out to side, resting on table, lower head toward arm, keeping trunk away from table. Hold __5__ seconds. Repeat _10___ times. Do __1__ sessions per day.  Copyright  VHI. All rights reserved.     Internal Rotation (Assistive)   Seated with elbow bent at right angle and held against side, slide arm on table surface in an inward arc. Repeat __10__ times. Do _1___ sessions per day. Activity: Use this motion to brush crumbs off the table.  Copyright  VHI. All rights reserved.
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
## 2014-04-20 ENCOUNTER — Ambulatory Visit (HOSPITAL_COMMUNITY): Payer: Disability Insurance

## 2014-04-20 NOTE — Addendum Note (Signed)
Encounter addended by: Debby Bud, OT on: 04/20/2014 10:21 AM<BR>     Documentation filed: Clinical Notes

## 2014-04-26 ENCOUNTER — Telehealth (HOSPITAL_COMMUNITY): Payer: Self-pay | Admitting: Specialist

## 2014-04-26 ENCOUNTER — Ambulatory Visit (HOSPITAL_COMMUNITY): Payer: Disability Insurance | Admitting: Specialist
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
## 2014-04-29 ENCOUNTER — Ambulatory Visit (HOSPITAL_COMMUNITY): Payer: Disability Insurance

## 2014-04-29 DIAGNOSIS — M6281 Muscle weakness (generalized): Secondary | ICD-10-CM | POA: Insufficient documentation

## 2014-04-29 DIAGNOSIS — Z5189 Encounter for other specified aftercare: Secondary | ICD-10-CM | POA: Insufficient documentation

## 2014-04-29 DIAGNOSIS — M25611 Stiffness of right shoulder, not elsewhere classified: Secondary | ICD-10-CM | POA: Insufficient documentation

## 2014-04-29 DIAGNOSIS — M25511 Pain in right shoulder: Secondary | ICD-10-CM | POA: Insufficient documentation

## 2014-05-03 ENCOUNTER — Encounter (HOSPITAL_COMMUNITY): Payer: Self-pay

## 2014-05-03 ENCOUNTER — Ambulatory Visit (HOSPITAL_COMMUNITY)
Admission: RE | Admit: 2014-05-03 | Discharge: 2014-05-03 | Disposition: A | Discharge status: Home / Self Care | Payer: Disability Insurance | Source: Ambulatory Visit | Attending: Orthopedic Surgery | Admitting: Orthopedic Surgery

## 2014-05-03 DIAGNOSIS — R531 Weakness: Secondary | ICD-10-CM

## 2014-05-03 DIAGNOSIS — M25511 Pain in right shoulder: Secondary | ICD-10-CM

## 2014-05-03 DIAGNOSIS — M25611 Stiffness of right shoulder, not elsewhere classified: Secondary | ICD-10-CM

## 2014-05-03 NOTE — Therapy (Signed)
[address] [place], [place], [address] Phone: [phone]   Fax:  [phone]  Occupational Therapy Treatment  Patient Details  Name: Nancy Espinoza MRN: [id number] Date of Birth: 12/23/1955  Encounter Date: 05/03/2014      OT End of Session - 05/03/14 0835    Visit Number 2   Number of Visits 18   Date for OT Re-Evaluation 05/26/14   Authorization Type [facility] approved through 07/02/2014   OT Start Time 0810   OT Stop Time 0855   OT Time Calculation (min) 45 min   Activity Tolerance Patient tolerated treatment well   Behavior During Therapy [facility] for tasks assessed/performed      Past Medical History  Diagnosis Date  . Back pain   . Knee pain, bilateral     Past Surgical History  Procedure Laterality Date  . Abdominal hysterectomy      There were no vitals taken for this visit.  Visit Diagnosis:  Generalized weakness  Pain in joint, shoulder region, right  Decreased range of motion of shoulder, right      Subjective Assessment - 05/03/14 0829    Symptoms S: I'm so sorry for missing a week I didn't even realize that I did. I've been off my med for a month now and I can't think straight.   Currently in Pain? Yes   Pain Score 6    Pain Location Shoulder   Pain Orientation Right;Left   Pain Descriptors / Indicators Aching   Pain Type Chronic pain          [facility] OT Assessment - 05/03/14 0830    Assessment   Prior Therapy None          OT Treatments/Exercises (OP) - 05/03/14 0830    Shoulder Exercises: Supine   Protraction PROM;5 reps;AAROM;10 reps   Horizontal ABduction PROM;5 reps;AAROM;10 reps   External Rotation PROM;5 reps;AAROM;10 reps   Internal Rotation PROM;5 reps;AAROM;10 reps   Flexion PROM;5 reps;AAROM;10 reps   ABduction PROM;5 reps;AAROM;10 reps   Shoulder Exercises: Standing   Extension AROM;10 reps   Row AROM;10 reps   Other Standing Exercises elevation; 10X; AROM   Modalities    Modalities Moist Heat   Moist Heat Therapy   Number Minutes Moist Heat 10 Minutes   Moist Heat Location Shoulder  right   Manual Therapy   Manual Therapy Myofascial release   Myofascial Release Muscle energy technique used with right medial deltoid to relax tone and improve range of motion. Positional release completed to medial deltoid  to reduce trigger point pain and relax muscle.             OT Short Term Goals - 05/03/14 0835    OT SHORT TERM GOAL #1   Title Patient will be educated on HEP.    Time 4   Period Weeks   Status On-going   OT SHORT TERM GOAL #2   Title Patient will decrease pain to 2/10 or less with daily tasks.    Time 4   Period Weeks   Status On-going   OT SHORT TERM GOAL #3   Title Patient will increase AROM to [facility] to increase ability to reach into overhead cabinets with less difficulty.    Time 4   Period Weeks   Status On-going   OT SHORT TERM GOAL #4   Title Patient will increase strength to 3+/5 to increase ability to complete cleaning tasks with less difficulty.  Time 4   Period Weeks   Status On-going          OT Long Term Goals - 05/03/14 0836    OT LONG TERM GOAL #1   Title Patient will return to highest level of independence with all BADL and IADL tasks.    Time 6   Period Weeks   Status On-going   OT LONG TERM GOAL #2   Title Patient will increase AROM to WNL to increase ability to complete overhead tasks with less difficulty.    Time 6   Period Weeks   Status On-going   OT LONG TERM GOAL #3   Title Patient will decrease fascial restrictions to trace in left arm.   Time 6   Period Weeks   Status On-going   OT LONG TERM GOAL #4   Title Patient will increase strength to 4-/5 to increase ability to complete housekeeping tasks.    Time 6   Period Weeks   Status On-going          Plan - 05/03/14 0936    Clinical Impression Statement A: Initiated muscle energy technique and positional release. Patient did experience  pain with about 75% full range during passive stretching. Added AAROM supine. Patient tolerated well with reports of pain.    Plan P: Add AAROM seated. Update HEP.              Problem List Patient Active Problem List   Diagnosis Date Noted  . Osteoarthritis, knee 11/06/2012  . Contusion of bone 07/23/2012    Ailene Ravel, OTR/L,CBIS  [phone]  05/03/2014, 9:38 AM

## 2014-05-05 ENCOUNTER — Ambulatory Visit (HOSPITAL_COMMUNITY): Payer: Disability Insurance

## 2014-05-10 ENCOUNTER — Ambulatory Visit (HOSPITAL_COMMUNITY)
Admission: RE | Admit: 2014-05-10 | Discharge: 2014-05-10 | Disposition: A | Discharge status: Home / Self Care | Payer: Disability Insurance | Source: Ambulatory Visit | Attending: Orthopedic Surgery | Admitting: Orthopedic Surgery

## 2014-05-10 ENCOUNTER — Encounter (HOSPITAL_COMMUNITY): Admission: RE | Payer: Self-pay | Source: Ambulatory Visit

## 2014-05-10 ENCOUNTER — Ambulatory Visit (HOSPITAL_COMMUNITY): Admission: RE | Admit: 2014-05-10 | Payer: Disability Insurance | Source: Ambulatory Visit | Admitting: Gastroenterology

## 2014-05-10 ENCOUNTER — Encounter (HOSPITAL_COMMUNITY): Payer: Self-pay

## 2014-05-10 DIAGNOSIS — R531 Weakness: Secondary | ICD-10-CM

## 2014-05-10 DIAGNOSIS — M25511 Pain in right shoulder: Secondary | ICD-10-CM

## 2014-05-10 DIAGNOSIS — M25611 Stiffness of right shoulder, not elsewhere classified: Secondary | ICD-10-CM

## 2014-05-10 SURGERY — COLONOSCOPY
Anesthesia: Moderate Sedation

## 2014-05-10 NOTE — Therapy (Signed)
[facility] [address], [place], [address] Phone: [phone]   Fax:  [phone]  Occupational Therapy Treatment  Patient Details  Name: Nancy Espinoza MRN: [id number] Date of Birth: 1956/04/03  Encounter Date: 05/10/2014      OT End of Session - 05/10/14 1154    Visit Number 3   Number of Visits 18   Date for OT Re-Evaluation 05/26/14   Authorization Type [facility] approved through 07/02/2014   OT Start Time 1112   OT Stop Time 1145   OT Time Calculation (min) 33 min   Activity Tolerance Patient tolerated treatment well   Behavior During Therapy [facility] for tasks assessed/performed      Past Medical History  Diagnosis Date  . Back pain   . Knee pain, bilateral     Past Surgical History  Procedure Laterality Date  . Abdominal hysterectomy      There were no vitals taken for this visit.  Visit Diagnosis:  Pain in joint, shoulder region, right  Decreased range of motion of shoulder, right  Generalized weakness      Subjective Assessment - 05/10/14 1134    Symptoms S: Ow! That hurts when you push on it.    Currently in Pain? Yes   Pain Score 9    Pain Location Shoulder   Pain Orientation Right   Pain Descriptors / Indicators Aching   Pain Type Chronic pain          [facility] OT Assessment - 05/10/14 1137    Precautions   Precautions None          OT Treatments/Exercises (OP) - 05/10/14 1137    Shoulder Exercises: Supine   Protraction PROM;5 reps;AAROM;10 reps   Horizontal ABduction PROM;5 reps;AAROM;10 reps   External Rotation PROM;5 reps;AAROM;10 reps   Internal Rotation PROM;5 reps;AAROM;10 reps   Flexion PROM;5 reps;AAROM;10 reps   ABduction PROM;5 reps;AAROM;10 reps   Manual Therapy   Manual Therapy Myofascial release   Myofascial Release Muscle energy technique used with right medial deltoid to relax tone and improve range of motion.            OT Short Term Goals - 05/10/14 1135    OT  SHORT TERM GOAL #1   Title Patient will be educated on HEP.    Time 4   Period Weeks   Status On-going   OT SHORT TERM GOAL #2   Title Patient will decrease pain to 2/10 or less with daily tasks.    Time 4   Period Weeks   Status On-going   OT SHORT TERM GOAL #3   Title Patient will increase AROM to [facility] to increase ability to reach into overhead cabinets with less difficulty.    Time 4   Period Weeks   Status On-going   OT SHORT TERM GOAL #4   Title Patient will increase strength to 3+/5 to increase ability to complete cleaning tasks with less difficulty.    Time 4   Period Weeks   Status On-going          OT Long Term Goals - 05/10/14 1135    OT LONG TERM GOAL #1   Title Patient will return to highest level of independence with all BADL and IADL tasks.    Status On-going   OT LONG TERM GOAL #2   Title Patient will increase AROM to WNL to increase ability to complete overhead tasks with less difficulty.    Status On-going  OT LONG TERM GOAL #3   Title Patient will decrease fascial restrictions to trace in left arm.   Status On-going   OT LONG TERM GOAL #4   Title Patient will increase strength to 4-/5 to increase ability to complete housekeeping tasks.    Status On-going          Plan - 05/10/14 1154    Clinical Impression Statement A; Pt arrived late to tx session. Pt reports that she has not been completing her HEP at home. Patient seems to be looking for "quick fix" to her shoulder pain. Provided education on the importance of drinking water and information on muscle knots and the cause of her pain. Patient is not well informed of the importance of a healthy lifestyle stating that she uses [employer] and Dr. Irena Cords for health information. Pt was very drowsy fdue to her anxiety medication.    Plan P: Add AAROM seated and update HEP to AAROM.         Problem List Patient Active Problem List   Diagnosis Date Noted  . Osteoarthritis, knee 11/06/2012  . Contusion of bone  07/23/2012    Ailene Ravel, OTR/L,CBIS  [phone]  05/10/2014, 11:57 AM

## 2014-05-11 ENCOUNTER — Telehealth: Payer: Self-pay | Admitting: Orthopedic Surgery

## 2014-05-11 ENCOUNTER — Other Ambulatory Visit: Payer: Self-pay | Admitting: *Deleted

## 2014-05-11 MED ORDER — HYDROCODONE-ACETAMINOPHEN 5-325 MG PO TABS: 1.0000 | ORAL_TABLET | Freq: Four times a day (QID) | ORAL | Status: DC | PRN

## 2014-05-11 NOTE — Telephone Encounter (Signed)
Patiient called to request refill of pain medication; states "hurting after doing physical therapy."  She has no follow up appointment scheduled.  Medication: HYDROcodone-acetaminophen (NORCO/VICODIN) 5-325 MG per tablet  / Ph# [phone]

## 2014-05-12 ENCOUNTER — Ambulatory Visit (HOSPITAL_COMMUNITY)
Admission: RE | Admit: 2014-05-12 | Discharge: 2014-05-12 | Disposition: A | Discharge status: Home / Self Care | Payer: Disability Insurance | Source: Ambulatory Visit | Attending: Orthopedic Surgery | Admitting: Orthopedic Surgery

## 2014-05-12 ENCOUNTER — Encounter (HOSPITAL_COMMUNITY): Payer: Self-pay

## 2014-05-12 DIAGNOSIS — M25511 Pain in right shoulder: Secondary | ICD-10-CM

## 2014-05-12 DIAGNOSIS — R531 Weakness: Secondary | ICD-10-CM

## 2014-05-12 DIAGNOSIS — M25611 Stiffness of right shoulder, not elsewhere classified: Secondary | ICD-10-CM

## 2014-05-12 NOTE — Therapy (Signed)
[facility] [place], [place], [address] Phone: [phone]   Fax:  [phone]  Occupational Therapy Treatment  Patient Details  Name: Nancy Espinoza MRN: [id number] Date of Birth: 06/22/1955  Encounter Date: 05/12/2014      OT End of Session - 05/12/14 1352    Visit Number 4   Number of Visits 18   Date for OT Re-Evaluation 05/26/14   Authorization Type [facility] approved through 07/02/2014   OT Start Time 0737   OT Stop Time 1345   OT Time Calculation (min) 38 min   Activity Tolerance Patient tolerated treatment well   Behavior During Therapy [facility] for tasks assessed/performed      Past Medical History  Diagnosis Date  . Back pain   . Knee pain, bilateral     Past Surgical History  Procedure Laterality Date  . Abdominal hysterectomy      There were no vitals taken for this visit.  Visit Diagnosis:  Pain in joint, shoulder region, right  Decreased range of motion of shoulder, right  Generalized weakness      Subjective Assessment - 05/12/14 1310    Symptoms "i think I was overmedicated the last time i was in here, I was sick. And I slept for 17 hours."   Currently in Pain? Yes   Pain Score 7    Pain Location Shoulder   Pain Orientation Right   Pain Descriptors / Indicators Aching   Pain Type Chronic pain          [facility] OT Assessment - 05/12/14 1311    Precautions   Precautions None          OT Treatments/Exercises (OP) - 05/12/14 1311    Shoulder Exercises: Supine   Protraction PROM;5 reps;AAROM;10 reps   Horizontal ABduction PROM;5 reps;AAROM;10 reps   External Rotation PROM;5 reps;AAROM;10 reps   Internal Rotation PROM;5 reps;AAROM;10 reps   Flexion PROM;5 reps;AAROM;10 reps   ABduction PROM;5 reps;AAROM;10 reps   Manual Therapy   Manual Therapy Myofascial release   Myofascial Release Muscle energy technique used with right medial deltoid to relax tone and improve range of motion.  MFR to  RUE upper arm and upper trap regions to decrease fascial restrictiosn and promote improved ROM.            OT Short Term Goals - 05/12/14 1357    OT SHORT TERM GOAL #1   Title Patient will be educated on HEP.    Status On-going   OT SHORT TERM GOAL #2   Title Patient will decrease pain to 2/10 or less with daily tasks.    Status On-going   OT SHORT TERM GOAL #3   Title Patient will increase AROM to [facility] to increase ability to reach into overhead cabinets with less difficulty.    Status On-going   OT SHORT TERM GOAL #4   Title Patient will increase strength to 3+/5 to increase ability to complete cleaning tasks with less difficulty.    Status On-going          OT Long Term Goals - 05/12/14 1338    OT LONG TERM GOAL #1   Title Patient will return to highest level of independence with all BADL and IADL tasks.    Status On-going   OT LONG TERM GOAL #2   Title Patient will increase AROM to WNL to increase ability to complete overhead tasks with less difficulty.    Status On-going   OT LONG  TERM GOAL #3   Title Patient will decrease fascial restrictions to trace in left arm.   Status On-going   OT LONG TERM GOAL #4   Title Patient will increase strength to 4-/5 to increase ability to complete housekeeping tasks.    Status On-going          Plan - 05/12/14 1353    Clinical Impression Statement Pt reports that 'chasing around that 56 year old' is enough exercise for her, and is not completing her recommended shoulder HEP at home. Pt reports not being comfortable laying flat, and required significant incline for supine positioning on mat.  Pt requried significant extra time to complete supine AAROM.  Seated AAROM not attempted this session, due to timing.  Pt with less drowsines this session, and reported awarness of taking too many medication before previous session.   Plan Complete seated AAROM rather than supine, and update HEP.       Problem List Patient Active Problem  List   Diagnosis Date Noted  . Osteoarthritis, knee 11/06/2012  . Contusion of bone 07/23/2012    Bea Graff Win Guajardo, MS, OTR/L [place] [phone] 05/12/2014, 1:58 PM

## 2014-05-12 NOTE — Telephone Encounter (Signed)
Prescription available, patient aware  

## 2014-05-12 NOTE — Telephone Encounter (Signed)
Patient picked up Rx

## 2014-05-17 ENCOUNTER — Encounter (HOSPITAL_COMMUNITY): Payer: Disability Insurance

## 2014-05-17 ENCOUNTER — Ambulatory Visit (HOSPITAL_COMMUNITY): Payer: Disability Insurance

## 2014-05-19 ENCOUNTER — Encounter (HOSPITAL_COMMUNITY): Payer: Disability Insurance

## 2014-05-19 ENCOUNTER — Ambulatory Visit (HOSPITAL_COMMUNITY): Payer: Disability Insurance

## 2014-05-24 ENCOUNTER — Ambulatory Visit (HOSPITAL_COMMUNITY)
Admission: RE | Admit: 2014-05-24 | Discharge: 2014-05-24 | Disposition: A | Discharge status: Home / Self Care | Payer: Disability Insurance | Source: Ambulatory Visit | Attending: Orthopedic Surgery | Admitting: Orthopedic Surgery

## 2014-05-24 DIAGNOSIS — M25611 Stiffness of right shoulder, not elsewhere classified: Secondary | ICD-10-CM

## 2014-05-24 DIAGNOSIS — M25511 Pain in right shoulder: Secondary | ICD-10-CM

## 2014-05-24 DIAGNOSIS — R531 Weakness: Secondary | ICD-10-CM

## 2014-05-24 NOTE — Therapy (Signed)
[place], [place], [address] Phone: [phone]   Fax:  [phone]  Occupational Therapy Treatment  Patient Details  Name: Nancy Espinoza MRN: [id number] Date of Birth: 1955-07-10  Encounter Date: 05/24/2014      OT End of Session - 05/24/14 1137    Visit Number 5   Number of Visits 18   Date for OT Re-Evaluation 05/26/14   Authorization Type [facility] approved through 07/02/2014   OT Start Time 1103   OT Stop Time 1133   OT Time Calculation (min) 30 min   Activity Tolerance Patient tolerated treatment well   Behavior During Therapy [facility] for tasks assessed/performed      Past Medical History  Diagnosis Date  . Back pain   . Knee pain, bilateral     Past Surgical History  Procedure Laterality Date  . Abdominal hysterectomy      There were no vitals taken for this visit.  Visit Diagnosis:  Pain in joint, shoulder region, right  Decreased range of motion of shoulder, right  Generalized weakness      Subjective Assessment - 05/24/14 1104    Symptoms S:  It hurts when Im here.  It pops alot.  It hurts whenever I try to use it.   Currently in Pain? Yes   Pain Score 2    Pain Location Shoulder   Pain Orientation Right   Pain Descriptors / Indicators Aching          [facility] OT Assessment - 05/24/14 0001    Precautions   Precautions None               OT Treatments/Exercises (OP) - 05/24/14 1105    Exercises   Exercises Shoulder   Shoulder Exercises: Supine   Protraction PROM;5 reps;AAROM;10 reps   Horizontal ABduction PROM;5 reps;AAROM;10 reps   External Rotation PROM;5 reps;AAROM;10 reps   Internal Rotation PROM;5 reps;AAROM;10 reps   Flexion PROM;5 reps;AAROM;10 reps   ABduction PROM;5 reps;AAROM;10 reps   Shoulder Exercises: Pulleys   Flexion 1 minute   ABduction 1 minute   Shoulder Exercises: Therapy Ball   Flexion Limitations   Flexion Limitations attempted and  unable to complete due to pain in her back   Shoulder Exercises: ROM/Strengthening   Proximal Shoulder Strengthening, Supine 10 times each unable to complete circles   Manual Therapy   Manual Therapy Myofascial release   Myofascial Release Myofascial release to right upper arm, scapular, shoulder, and associate region to decrease pain and restrictions and improve pain free mobility.                  OT Short Term Goals - 05/12/14 1357    OT SHORT TERM GOAL #1   Title Patient will be educated on HEP.    Status On-going   OT SHORT TERM GOAL #2   Title Patient will decrease pain to 2/10 or less with daily tasks.    Status On-going   OT SHORT TERM GOAL #3   Title Patient will increase AROM to [facility] to increase ability to reach into overhead cabinets with less difficulty.    Status On-going   OT SHORT TERM GOAL #4   Title Patient will increase strength to 3+/5 to increase ability to complete cleaning tasks with less difficulty.    Status On-going           OT Long Term Goals - 05/12/14 1338    OT LONG TERM  GOAL #1   Title Patient will return to highest level of independence with all BADL and IADL tasks.    Status On-going   OT LONG TERM GOAL #2   Title Patient will increase AROM to WNL to increase ability to complete overhead tasks with less difficulty.    Status On-going   OT LONG TERM GOAL #3   Title Patient will decrease fascial restrictions to trace in left arm.   Status On-going   OT LONG TERM GOAL #4   Title Patient will increase strength to 4-/5 to increase ability to complete housekeeping tasks.    Status On-going               Plan - 05/24/14 1137    Clinical Impression Statement A:  patient feels therapy irritates rather than helps pain.  added pulleys for improved aarom.   Plan P:  Reassess, seated AAROM, HEP update to AAROM.        Problem List Patient Active Problem List   Diagnosis Date Noted  . Osteoarthritis, knee 11/06/2012  .  Contusion of bone 07/23/2012    Vangie Bicker, OTR/L [phone]  05/24/2014, 11:40 AM  [place] [address] [place], [place], [address] Phone: [phone]   Fax:  [phone]

## 2014-05-26 ENCOUNTER — Ambulatory Visit (HOSPITAL_COMMUNITY)
Admission: RE | Admit: 2014-05-26 | Discharge: 2014-05-26 | Disposition: A | Discharge status: Home / Self Care | Payer: Disability Insurance | Source: Ambulatory Visit | Attending: Orthopedic Surgery | Admitting: Orthopedic Surgery

## 2014-05-26 DIAGNOSIS — R531 Weakness: Secondary | ICD-10-CM

## 2014-05-26 DIAGNOSIS — M25611 Stiffness of right shoulder, not elsewhere classified: Secondary | ICD-10-CM

## 2014-05-26 DIAGNOSIS — M25511 Pain in right shoulder: Secondary | ICD-10-CM

## 2014-05-26 NOTE — Therapy (Signed)
[place], [place], [address] Phone: [phone]   Fax:  [phone]  Occupational Therapy Treatment  Patient Details  Name: Nancy Espinoza MRN: [id number] Date of Birth: [birth year]  Encounter Date: 05/26/2014      OT End of Session - 05/26/14 1140    Visit Number 6   Number of Visits 18   Date for OT Re-Evaluation 05/26/14   Authorization Type [facility] approved through 07/02/2014   OT Start Time 1105   OT Stop Time 1138   OT Time Calculation (min) 33 min   Activity Tolerance Patient tolerated treatment well   Behavior During Therapy [facility] for tasks assessed/performed      Past Medical History  Diagnosis Date  . Back pain   . Knee pain, bilateral     Past Surgical History  Procedure Laterality Date  . Abdominal hysterectomy      There were no vitals taken for this visit.  Visit Diagnosis:  Pain in joint, shoulder region, right  Decreased range of motion of shoulder, right  Generalized weakness      Subjective Assessment - 05/26/14 1132    Special Tests FOTO score 25/100 was 35/100          [facility] OT Assessment - 05/26/14 1109    Assessment   Diagnosis Right rotator cuff syndrome   Precautions   Precautions None   Prior Function   Level of Independence Independent with basic ADLs;Independent with gait   ADL   ADL comments cant brush hair, fasten bra, light housekeeping is still difficulty.  using left arm only with most activities.   Observation/Other Assessments   Observations Min fascial restrictions in right upper arm, trapezius, and scapularis region.    Focus on Therapeutic Outcomes (FOTO)  25/100 FOTO   AROM   Overall AROM Comments Assessed in seated. IR/ER adducted (04/19/14)    Right Shoulder Flexion 128 Degrees  105   Right Shoulder ABduction 125 Degrees  83   Right Shoulder Internal Rotation 80 Degrees  85   Right Shoulder External Rotation 45 Degrees  59   Strength   Right Shoulder Flexion 4-/5  3-/5   Right Shoulder ABduction 4/5  3-/5   Right Shoulder Internal Rotation 4-/5  3/5   Right Shoulder External Rotation 4-/5  3/5               OT Treatments/Exercises (OP) - 05/26/14 1109    Manual Therapy   Manual Therapy Myofascial release   Myofascial Release Myofascial release to right upper arm, scapular, shoulder, and associate region to decrease pain and restrictions and improve pain free mobility.                OT Education - 05/26/14 1140    Education provided Yes   Education Details table slides   Person(s) Educated Patient   Methods Explanation   Comprehension Verbalized understanding          OT Short Term Goals - 05/26/14 1123    OT SHORT TERM GOAL #1   Title Patient will be educated on HEP.    Status Achieved   OT SHORT TERM GOAL #2   Title Patient will decrease pain to 2/10 or less with daily tasks.    Status On-going   OT SHORT TERM GOAL #3   Title Patient will increase AROM to [facility] to increase ability to reach into overhead cabinets with less difficulty.    Status On-going  OT SHORT TERM GOAL #4   Title Patient will increase strength to 3+/5 to increase ability to complete cleaning tasks with less difficulty.    Status Achieved           OT Long Term Goals - 05/26/14 1124    OT LONG TERM GOAL #1   Title Patient will return to highest level of independence with all BADL and IADL tasks.    Status On-going   OT LONG TERM GOAL #2   Title Patient will increase AROM to WNL to increase ability to complete overhead tasks with less difficulty.    OT LONG TERM GOAL #3   Title Patient will decrease fascial restrictions to trace in left arm.   Status On-going   OT LONG TERM GOAL #4   Title Patient will increase strength to 4-/5 to increase ability to complete housekeeping tasks.    Status Partially Met               Plan - 05/26/14 1141    Clinical Impression Statement A:  Patient  has made improvements in her AROM and strength while coming to therapy.  However, her pain level in her right shoulder has worsened to a 7/10.  Patient reports no improvement with functional activities at home using her right arm.  She continually states that therapy is not doing anything but hurting her.  Therefore, after discussing with the patient we have opted to dc skilled OT intervention at this time.    Plan P:  DC skilled OT services.  Recommend following up with MD for next steps.    Consulted and Agree with Plan of Care Patient        Problem List Patient Active Problem List   Diagnosis Date Noted  . Osteoarthritis, knee 11/06/2012  . Contusion of bone 07/23/2012   OCCUPATIONAL THERAPY DISCHARGE SUMMARY  Visits from Start of Care: 6 Current functional level related to goals / functional outcomes: See above   Remaining deficits: A:  Patient has made improvements in her AROM and strength while coming to therapy.  However, her pain level in her right shoulder has worsened to a 7/10.  Patient reports no improvement with functional activities at home using her right arm.  She continually states that therapy is not doing anything but hurting her.  Therefore, after discussing with the patient we have opted to dc skilled OT intervention at this time.     Education / Equipment: Towel slides  Plan: Patient agrees to discharge.  Patient goals were partially met. Patient is being discharged due to the patient's request.  ?????      05/26/2014, 11:44 AM Vangie Bicker, OTR/L [place] [address] [place], [place], [address] Phone: [phone]   Fax:  [phone]

## 2014-05-27 ENCOUNTER — Telehealth: Payer: Self-pay | Admitting: Gastroenterology

## 2014-05-27 NOTE — Telephone Encounter (Signed)
Spoke to the patient and made her aware her [facility] expired 05/16/14 and told her she needed to come to the office prior to her appointment to fill out paperwork to re-file for the financial assistance.  Let her know that if she was self pay there is a 25$ deposit expected at time of appointment and she understood.

## 2014-05-31 ENCOUNTER — Ambulatory Visit (HOSPITAL_COMMUNITY): Payer: Disability Insurance | Admitting: Specialist

## 2014-06-01 ENCOUNTER — Encounter: Payer: Self-pay | Admitting: Gastroenterology

## 2014-06-01 ENCOUNTER — Ambulatory Visit: Payer: Disability Insurance | Admitting: Gastroenterology

## 2014-06-01 ENCOUNTER — Telehealth: Payer: Self-pay | Admitting: Gastroenterology

## 2014-06-01 NOTE — Telephone Encounter (Signed)
Patient was a no show 06/01/14 and letter sent

## 2014-06-07 ENCOUNTER — Ambulatory Visit: Payer: Disability Insurance | Admitting: Orthopedic Surgery

## 2014-07-15 ENCOUNTER — Ambulatory Visit (INDEPENDENT_AMBULATORY_CARE_PROVIDER_SITE_OTHER): Payer: Self-pay | Admitting: Orthopedic Surgery

## 2014-07-15 ENCOUNTER — Encounter: Payer: Self-pay | Admitting: Orthopedic Surgery

## 2014-07-15 VITALS — BP 132/80 | Ht 67.0 in | Wt 177.0 lb

## 2014-07-15 DIAGNOSIS — M75101 Unspecified rotator cuff tear or rupture of right shoulder, not specified as traumatic: Secondary | ICD-10-CM

## 2014-07-15 MED ORDER — HYDROCODONE-ACETAMINOPHEN 5-325 MG PO TABS: 1.0000 | ORAL_TABLET | Freq: Four times a day (QID) | ORAL | Status: DC | PRN

## 2014-07-15 NOTE — Patient Instructions (Signed)
Call to arrange therapy at [facility]

## 2014-07-15 NOTE — Progress Notes (Signed)
Chief Complaint  Patient presents with  . Follow-up    follow up right shoulder pain s/p therapy    Her current pain right shoulder patient did not complete all of her physical therapy visits continuous complaint of pain and stiffness and decreased forward elevation  Review of systems normal  Exam shows limited forward elevation in flexion as well as abduction external rotation is normal internal patient is limited. Strength in the rotator cuff in the internal and external rotators remains normal she has weakness in abduction and forward elevation. She'll remain stable in abduction and rotation skin is intact lymph nodes are negative sensation is normal she has normal pulses in the right arm  Impression rotator cuff syndrome right shoulder  Repeat injection in reorder therapy  Follow-up in 7 weeks  Orders Placed This Encounter  Procedures  . Ambulatory referral to Occupational Therapy    Referral Priority:  Routine    Referral Type:  Occupational Therapy    Referral Reason:  Specialty Services Required    Requested Specialty:  Occupational Therapy    Number of Visits Requested:  1   Meds ordered this encounter  Medications  . HYDROcodone-acetaminophen (NORCO/VICODIN) 5-325 MG per tablet    Sig: Take 1 tablet by mouth every 6 (six) hours as needed for moderate pain.    Dispense:  120 tablet    Refill:  0

## 2014-07-20 ENCOUNTER — Ambulatory Visit (HOSPITAL_COMMUNITY): Payer: No Typology Code available for payment source

## 2014-07-21 ENCOUNTER — Ambulatory Visit (HOSPITAL_COMMUNITY): Payer: No Typology Code available for payment source | Attending: Orthopedic Surgery

## 2014-07-21 ENCOUNTER — Encounter (HOSPITAL_COMMUNITY): Payer: Self-pay

## 2014-07-21 DIAGNOSIS — Z5189 Encounter for other specified aftercare: Secondary | ICD-10-CM | POA: Insufficient documentation

## 2014-07-21 DIAGNOSIS — M6281 Muscle weakness (generalized): Secondary | ICD-10-CM | POA: Insufficient documentation

## 2014-07-21 DIAGNOSIS — M25511 Pain in right shoulder: Secondary | ICD-10-CM

## 2014-07-21 DIAGNOSIS — M75101 Unspecified rotator cuff tear or rupture of right shoulder, not specified as traumatic: Secondary | ICD-10-CM

## 2014-07-21 DIAGNOSIS — M25611 Stiffness of right shoulder, not elsewhere classified: Secondary | ICD-10-CM

## 2014-07-21 NOTE — Patient Instructions (Signed)
SHOULDER: Flexion On Table   Place hands on table, elbows straight. Move hips away from body. Press hands down into table. Hold _5__ seconds. _10__ reps per set  Abduction (Passive)   With arm out to side, resting on table, lower head toward arm, keeping trunk away from table. Hold _5___ seconds. Repeat 10____ times. Do __2__ sessions per day.      Internal Rotation (Assistive)   Seated with elbow bent at right angle and held against side, slide arm on table surface in an inward arc. Repeat 10___ times. Do _2-3___ sessions per day. Activity: Use this motion to brush crumbs off the table.   Flexibility: Corner Stretch   Standing in corner with hands just above shoulder level; 2 feet  from corner, lean forward until a comfortable stretch is felt across chest. Hold __5__ seconds. Repeat _10___ times per set. Do _1___ sets per session. Do _2___ sessions per day.     Scapular Retraction (Standing)   With arms at sides, pinch shoulder blades together. Repeat _10___ times per set. Do __1__ sets per session. Do __2__ sessions per day.    Posterior Capsule Stretch   Stand or sit, one arm across body so hand rests over opposite shoulder. Gently push on crossed elbow with other hand until stretch is felt in shoulder of crossed arm. Hold _10__ seconds.  Repeat _10__ times per session. Do _2__ sessions per day.    Flexors Stretch, Standing   Stand near wall and slide arm up, with palm facing away from wall, by leaning toward wall. Hold _10__ seconds.  Repeat _10__ times per session. Do _2__ sessions per day.

## 2014-07-21 NOTE — Therapy (Signed)
[place], [place], [address] Phone: [phone]   Fax:  [phone]  Occupational Therapy Evaluation  Patient Details  Name: Nancy Espinoza MRN: [id number] Date of Birth: [birth year] Referring Provider:  Carole Civil, MD  Encounter Date: 07/21/2014      OT End of Session - 07/21/14 1237    Visit Number 1   Number of Visits 12   Date for OT Re-Evaluation 09/15/14  08/18/14 mini reassessment   Authorization Type [facility] approved 06/10/14-12/09/14   OT Start Time 1105   OT Stop Time 1150   OT Time Calculation (min) 45 min   Activity Tolerance Patient tolerated treatment well   Behavior During Therapy [facility] for tasks assessed/performed      Past Medical History  Diagnosis Date  . Back pain   . Knee pain, bilateral     Past Surgical History  Procedure Laterality Date  . Abdominal hysterectomy      There were no vitals taken for this visit.  Visit Diagnosis:  Rotator cuff syndrome of right shoulder - Plan: Ot plan of care cert/re-cert  Pain in joint, shoulder region, right - Plan: Ot plan of care cert/re-cert  Decreased range of motion of shoulder, right - Plan: Ot plan of care cert/re-cert      Subjective Assessment - 07/21/14 1111    Symptoms S: I felt like once I stopped coming to therapy my arm got worse.    Pertinent History Patient is a 59 y/o female returning to therapy for right rotator cuff syndrome. patient had previously been to this OP clinic and was receiving occupational therapy for 5 weeks. Patient was discharged from therapy as she felt it was not helping. Patient reported that she had increased pain in her right shoulder although she did show improvementts with AROM and strength. Pt is returning to therapy for same diagnosis  to try therapy for 6 weeks.    Special Tests FOTO score: 49/100 (51% impaired)          [facility] OT Assessment - 07/21/14 1113    Assessment   Diagnosis  Right rotator cuff syndrome   Onset Date --  04/2013   Prior Therapy [facility]   Precautions   Precautions None   Restrictions   Weight Bearing Restrictions No   Balance Screen   Has the patient fallen in the past 6 months No   Has the patient had a decrease in activity level because of a fear of falling?  No   Is the patient reluctant to leave their home because of a fear of falling?  No   Home  Environment   Family/patient expects to be discharged to: Private residence   Living Arrangements Alone   Prior Function   Level of Independence Independent with basic ADLs;Independent with gait   Leisure Patient has 5 children and 22 grandchildren.   ADL   ADL comments Can't reach into overhead cabinets, can't brush hair or use right arm for dominant tasks.    Mobility   Mobility Status Independent   Written Expression   Dominant Hand Right   Vision - History   Baseline Vision Wears glasses all the time   Cognition   Overall Cognitive Status Within Functional Limits for tasks assessed   Observation/Other Assessments   Observations Mod fascial restrictions in right upper arm, anterior deltoid, trapezius, and scapularis region.    Focus on Therapeutic Outcomes (FOTO)  49/100   ROM /  Strength   AROM / PROM / Strength AROM;PROM;Strength   AROM   Overall AROM Comments Assessed in seated. IR/ER adducted    Right Shoulder Flexion 115 Degrees   Right Shoulder ABduction 85 Degrees   Right Shoulder Internal Rotation 90 Degrees   Right Shoulder External Rotation 65 Degrees   PROM   Overall PROM Comments Assessed seated. ER/IR adduected   PROM Assessment Site Shoulder   Right/Left Shoulder Right   Right Shoulder Flexion 122 Degrees   Right Shoulder ABduction 91 Degrees   Right Shoulder Internal Rotation 90 Degrees   Right Shoulder External Rotation 72 Degrees   Strength   Overall Strength Comments Assessed seated. IR/ER adducted.   Right Shoulder Flexion 3-/5   Right Shoulder ABduction  3-/5   Right Shoulder Internal Rotation 3/5   Right Shoulder External Rotation 3-/5                       OT Education - 07/21/14 1136    Education provided Yes   Education Details table slides and shoulder stretches   Person(s) Educated Patient   Methods Explanation;Demonstration;Handout   Comprehension Verbalized understanding          OT Short Term Goals - 07/21/14 1248    OT SHORT TERM GOAL #1   Title Patient will be educated on HEP.    Time 3   Period Weeks   Status New   OT SHORT TERM GOAL #2   Title Patient will decrease pain to 2/10 or less with daily tasks.    Time 3   Period Weeks   Status New   OT SHORT TERM GOAL #3   Title Patient will increase AROM to [facility] to increase ability to reach into overhead cabinets with less difficulty.    Time 3   Period Weeks   Status New   OT SHORT TERM GOAL #4   Title Patient will increase strength to 3+/5 to increase ability to complete cleaning tasks with less difficulty.    Time 3   Period Weeks   Status New           OT Long Term Goals - 07/21/14 1249    OT LONG TERM GOAL #1   Title Patient will return to highest level of independence with all BADL and IADL tasks.    Time 6   Period Weeks   Status New   OT LONG TERM GOAL #2   Title Patient will increase AROM to WNL to increase ability to complete overhead tasks with less difficulty.    Time 6   Period Weeks   Status New   OT LONG TERM GOAL #3   Title Patient fascial restrictions in min amount in right arm.    Time 6   Period Weeks   Status New   OT LONG TERM GOAL #4   Title Patient will increase strength to 4-/5 to increase ability to complete housekeeping tasks.    Time 6   Period Weeks   Status New               Plan - 07/21/14 1238    Clinical Impression Statement A: Patient is a 59 y/o female s/p right rotator cuff syndrome causing increased pain, fascial restrictions decreased strength and ROM causing increased difficulty  completing daily tasks. Recommended patient purchase a reacher for retrieving items in overhead cabinets.    Pt will benefit from skilled therapeutic intervention in order to improve on  the following deficits (Retired) Decreased range of motion;Increased fascial restricitons;Pain;Decreased strength   Rehab Potential Good   OT Frequency 2x / week   OT Duration 6 weeks   OT Treatment/Interventions Self-care/ADL training;Therapeutic exercise;Patient/family education;Manual Therapy;Neuromuscular education;Therapeutic activities;Cryotherapy;Electrical Stimulation;Passive range of motion;Moist Heat   Plan P: Patient will benefit from skilled OT services to increase functional performance during daily tasks using right arm as dominant. Treatment Plan: Myofascial release, muscle energy technique, ice, heat, passive stretching, AAROM, AROM, general strengthening.    Consulted and Agree with Plan of Care Patient        Problem List Patient Active Problem List   Diagnosis Date Noted  . Osteoarthritis, knee 11/06/2012  . Contusion of bone 07/23/2012    Ailene Ravel, OTR/L,CBIS  [phone]  07/21/2014, 12:53 PM  [place] [address] [place], [place], [address] Phone: [phone]   Fax:  [phone]

## 2014-07-26 ENCOUNTER — Ambulatory Visit (HOSPITAL_COMMUNITY): Payer: No Typology Code available for payment source

## 2014-07-28 ENCOUNTER — Encounter (HOSPITAL_COMMUNITY): Payer: Self-pay | Admitting: Specialist

## 2014-08-04 ENCOUNTER — Encounter (HOSPITAL_COMMUNITY): Payer: Self-pay | Admitting: Occupational Therapy

## 2014-08-04 ENCOUNTER — Ambulatory Visit (HOSPITAL_COMMUNITY): Payer: No Typology Code available for payment source | Attending: Orthopedic Surgery | Admitting: Occupational Therapy

## 2014-08-04 DIAGNOSIS — M25611 Stiffness of right shoulder, not elsewhere classified: Secondary | ICD-10-CM

## 2014-08-04 DIAGNOSIS — M25511 Pain in right shoulder: Secondary | ICD-10-CM | POA: Insufficient documentation

## 2014-08-04 DIAGNOSIS — Z5189 Encounter for other specified aftercare: Secondary | ICD-10-CM | POA: Insufficient documentation

## 2014-08-04 DIAGNOSIS — M6281 Muscle weakness (generalized): Secondary | ICD-10-CM | POA: Insufficient documentation

## 2014-08-04 NOTE — Therapy (Signed)
[place], [place], [address] Phone: [phone]   Fax:  [phone]  Occupational Therapy Treatment  Patient Details  Name: Nancy Espinoza MRN: [id number] Date of Birth: 10-25-1955 Referring Provider:  Carole Civil, MD  Encounter Date: 08/04/2014      OT End of Session - 08/04/14 1420    Visit Number 2   Number of Visits 12   Date for OT Re-Evaluation 09/15/14  08/18/14 mini reassessment   Authorization Type [facility] approved 06/10/14-12/09/14   OT Start Time 5883   OT Stop Time 1418   OT Time Calculation (min) 29 min   Activity Tolerance Patient tolerated treatment well   Behavior During Therapy [facility] for tasks assessed/performed      Past Medical History  Diagnosis Date  . Back pain   . Knee pain, bilateral     Past Surgical History  Procedure Laterality Date  . Abdominal hysterectomy      There were no vitals taken for this visit.  Visit Diagnosis:  Pain in joint, shoulder region, right  Decreased range of motion of shoulder, right      Subjective Assessment - 08/04/14 1353    Symptoms S: I cleaned out my cabinets yesterday and it's really hurting today.    Currently in Pain? Yes   Pain Score 7    Pain Location Shoulder   Pain Orientation Right   Pain Descriptors / Indicators Aching   Pain Type Chronic pain          [facility] OT Assessment - 08/04/14 1419    Assessment   Diagnosis Right rotator cuff syndrome   Precautions   Precautions None               OT Treatments/Exercises (OP) - 08/04/14 1352    Exercises   Exercises Shoulder;Elbow   Shoulder Exercises: Supine   Protraction PROM;10 reps   Horizontal ABduction PROM;10 reps   External Rotation PROM;10 reps   Internal Rotation PROM;10 reps   Flexion PROM;10 reps   ABduction PROM;10 reps   Manual Therapy   Manual Therapy Myofascial release   Myofascial Release Myofascial release to right upper arm,  scapular, shoulder, and associate region to decrease pain and restrictions and improve pain free mobility.                  OT Short Term Goals - 08/04/14 1422    OT SHORT TERM GOAL #1   Title Patient will be educated on HEP.    Time 3   Period Weeks   Status On-going   OT SHORT TERM GOAL #2   Title Patient will decrease pain to 2/10 or less with daily tasks.    Time 3   Period Weeks   Status On-going   OT SHORT TERM GOAL #3   Title Patient will increase AROM to [facility] to increase ability to reach into overhead cabinets with less difficulty.    Time 3   Period Weeks   Status On-going   OT SHORT TERM GOAL #4   Title Patient will increase strength to 3+/5 to increase ability to complete cleaning tasks with less difficulty.    Time 3   Period Weeks   Status On-going   OT SHORT TERM GOAL #5   Status --           OT Long Term Goals - 08/04/14 1422    OT LONG TERM GOAL #1   Title  Patient will return to highest level of independence with all BADL and IADL tasks.    Time 6   Period Weeks   Status On-going   OT LONG TERM GOAL #2   Title Patient will increase AROM to WNL to increase ability to complete overhead tasks with less difficulty.    Time 6   Period Weeks   Status On-going   OT LONG TERM GOAL #3   Title Patient fascial restrictions in min amount in right arm.    Time 6   Period Weeks   Status On-going   OT LONG TERM GOAL #4   Title Patient will increase strength to 4-/5 to increase ability to complete housekeeping tasks.    Time 6   Period Weeks   Status On-going               Plan - 08/04/14 1421    Clinical Impression Statement A: Initiated myofascial release and manual stretching this session. Pt requested to leave at 1418 due to having an appointment to be at. Pt reports increased soreness at end of session.    Plan P: Continue manual therapy, begin AAROM.         Problem List Patient Active Problem List   Diagnosis Date Noted  .  Osteoarthritis, knee 11/06/2012  . Contusion of bone 07/23/2012    Guadelupe Sabin, OTR/L [phone]  08/04/2014, 2:26 PM  [place] [address] [place], [place], [address] Phone: [phone]   Fax:  [phone]

## 2014-08-06 ENCOUNTER — Ambulatory Visit (HOSPITAL_COMMUNITY): Payer: No Typology Code available for payment source

## 2014-08-09 ENCOUNTER — Ambulatory Visit (HOSPITAL_COMMUNITY): Payer: No Typology Code available for payment source

## 2014-08-09 ENCOUNTER — Telehealth (HOSPITAL_COMMUNITY): Payer: Self-pay

## 2014-08-09 NOTE — Telephone Encounter (Signed)
She has been having car trouble all along and can not chance the  Ride to [place] today.

## 2014-08-11 ENCOUNTER — Ambulatory Visit (HOSPITAL_COMMUNITY): Payer: No Typology Code available for payment source | Admitting: Occupational Therapy

## 2014-08-11 ENCOUNTER — Encounter (HOSPITAL_COMMUNITY): Payer: Self-pay | Admitting: Occupational Therapy

## 2014-08-11 ENCOUNTER — Telehealth: Payer: Self-pay | Admitting: Orthopedic Surgery

## 2014-08-11 ENCOUNTER — Other Ambulatory Visit: Payer: Self-pay | Admitting: *Deleted

## 2014-08-11 DIAGNOSIS — M25611 Stiffness of right shoulder, not elsewhere classified: Secondary | ICD-10-CM

## 2014-08-11 DIAGNOSIS — M25511 Pain in right shoulder: Secondary | ICD-10-CM

## 2014-08-11 MED ORDER — HYDROCODONE-ACETAMINOPHEN 5-325 MG PO TABS
1.0000 | ORAL_TABLET | Freq: Four times a day (QID) | ORAL | Status: DC | PRN
Start: 2014-08-11 — End: 2014-09-30

## 2014-08-11 NOTE — Telephone Encounter (Signed)
Prescription available, patient aware  

## 2014-08-11 NOTE — Therapy (Signed)
[place], [place], [address] Phone: [phone]   Fax:  [phone]  Occupational Therapy Treatment  Patient Details  Name: Nancy Espinoza MRN: [id number] Date of Birth: 12/30/1955 Referring Provider:  Carole Civil, MD  Encounter Date: 08/11/2014      OT End of Session - 08/11/14 1512    Visit Number 3   Number of Visits 12   Date for OT Re-Evaluation 09/15/14  08/18/14 mini reassessment   Authorization Type [facility] approved 06/10/14-12/09/14   OT Start Time 1433   OT Stop Time 1520   OT Time Calculation (min) 47 min   Activity Tolerance Patient tolerated treatment well   Behavior During Therapy [facility] for tasks assessed/performed      Past Medical History  Diagnosis Date  . Back pain   . Knee pain, bilateral     Past Surgical History  Procedure Laterality Date  . Abdominal hysterectomy      There were no vitals filed for this visit.  Visit Diagnosis:  Pain in joint, shoulder region, right  Decreased range of motion of shoulder, right      Subjective Assessment - 08/11/14 1433    Symptoms S: I'm trying to get my doctor to refill my pain medicine.    Currently in Pain? Yes   Pain Score 7    Pain Location Shoulder   Pain Orientation Right   Pain Descriptors / Indicators Aching   Pain Type Chronic pain            [facility] OT Assessment - 08/11/14 1459    Assessment   Diagnosis Right rotator cuff syndrome   Precautions   Precautions None                OT Treatments/Exercises (OP) - 08/11/14 1457    Exercises   Exercises Shoulder   Shoulder Exercises: Supine   Protraction PROM;5 reps;AAROM;10 reps   Horizontal ABduction PROM;5 reps;AAROM;10 reps   External Rotation PROM;5 reps;AAROM;10 reps   Internal Rotation PROM;5 reps;AAROM;10 reps   Flexion PROM;5 reps;AAROM;10 reps   ABduction PROM;5 reps;AAROM;10 reps   Modalities   Modalities Electrical Stimulation;Moist  Heat   Moist Heat Therapy   Number Minutes Moist Heat 10 Minutes   Moist Heat Location Shoulder  right   Electrical Stimulation   Electrical Stimulation Location left shoulder   Electrical Stimulation Action interferential   Electrical Stimulation Parameters 12.5 volts   Electrical Stimulation Goals Pain   Manual Therapy   Manual Therapy Myofascial release   Myofascial Release Myofascial release to right upper arm, scapular, shoulder, and associate region to decrease pain and restrictions and improve pain free mobility.                  OT Short Term Goals - 08/04/14 1422    OT SHORT TERM GOAL #1   Title Patient will be educated on HEP.    Time 3   Period Weeks   Status On-going   OT SHORT TERM GOAL #2   Title Patient will decrease pain to 2/10 or less with daily tasks.    Time 3   Period Weeks   Status On-going   OT SHORT TERM GOAL #3   Title Patient will increase AROM to [facility] to increase ability to reach into overhead cabinets with less difficulty.    Time 3   Period Weeks   Status On-going   OT SHORT TERM GOAL #4  Title Patient will increase strength to 3+/5 to increase ability to complete cleaning tasks with less difficulty.    Time 3   Period Weeks   Status On-going                  OT Long Term Goals - 08/04/14 1422    OT LONG TERM GOAL #1   Title Patient will return to highest level of independence with all BADL and IADL tasks.    Time 6   Period Weeks   Status On-going   OT LONG TERM GOAL #2   Title Patient will increase AROM to WNL to increase ability to complete overhead tasks with less difficulty.    Time 6   Period Weeks   Status On-going   OT LONG TERM GOAL #3   Title Patient fascial restrictions in min amount in right arm.    Time 6   Period Weeks   Status On-going   OT LONG TERM GOAL #4   Title Patient will increase strength to 4-/5 to increase ability to complete housekeeping tasks.    Time 6   Period Weeks   Status On-going                Plan - 08/11/14 1524    Clinical Impression Statement A: Added AAROM exercises in supine. ES and moist heat applied at end of session for pain. Pt reports she has been doing exercises at home, however at times they increase the pain in her shoulder. Pt reports she is trying to get her prescription refilled for her pain medicine from Dr. Adron Bene.    Plan P: Continue manual therapy. Add AAROM in sitting if able to tolerate.         Problem List Patient Active Problem List   Diagnosis Date Noted  . Osteoarthritis, knee 11/06/2012  . Contusion of bone 07/23/2012    Guadelupe Sabin, OTR/L  [phone]  08/11/2014, 3:29 PM  [place] [address] [place], [place], [address] Phone: [phone]   Fax:  [phone]

## 2014-08-11 NOTE — Telephone Encounter (Signed)
Patient is calling requesting a refill on pain medication due to Physical Therapy HYDROcodone-acetaminophen (NORCO/VICODIN) 5-325 MG per tablet  Please advise?

## 2014-08-16 ENCOUNTER — Ambulatory Visit (HOSPITAL_COMMUNITY): Payer: No Typology Code available for payment source

## 2014-08-17 NOTE — Telephone Encounter (Signed)
Patient picked up Rx

## 2014-08-18 ENCOUNTER — Encounter (HOSPITAL_COMMUNITY): Payer: Self-pay | Admitting: Occupational Therapy

## 2014-08-23 ENCOUNTER — Encounter (HOSPITAL_COMMUNITY): Payer: Self-pay

## 2014-08-23 ENCOUNTER — Ambulatory Visit (HOSPITAL_COMMUNITY): Payer: No Typology Code available for payment source

## 2014-08-23 DIAGNOSIS — R531 Weakness: Secondary | ICD-10-CM

## 2014-08-23 DIAGNOSIS — M25511 Pain in right shoulder: Secondary | ICD-10-CM

## 2014-08-23 DIAGNOSIS — M25611 Stiffness of right shoulder, not elsewhere classified: Secondary | ICD-10-CM

## 2014-08-23 NOTE — Therapy (Signed)
[place], [place], [address] Phone: [phone]   Fax:  [phone]  Occupational Therapy Treatment  Patient Details  Name: Nancy Espinoza MRN: [id number] Date of Birth: 1955-08-21 Referring Provider:  Carole Civil, MD  Encounter Date: 08/23/2014      OT End of Session - 08/23/14 1531    Visit Number 4   Number of Visits 12   Date for OT Re-Evaluation 09/15/14   Authorization Type [facility] approved 06/10/14-12/09/14   OT Start Time 1438   OT Stop Time 1535   OT Time Calculation (min) 57 min   Activity Tolerance Patient tolerated treatment well   Behavior During Therapy [facility] for tasks assessed/performed      Past Medical History  Diagnosis Date  . Back pain   . Knee pain, bilateral     Past Surgical History  Procedure Laterality Date  . Abdominal hysterectomy      There were no vitals filed for this visit.  Visit Diagnosis:  Pain in joint, shoulder region, right  Decreased range of motion of shoulder, right  Generalized weakness      Subjective Assessment - 08/23/14 1442    Symptoms S:Pt stated "I was thinking about taking some pain medicine."   Currently in Pain? Yes   Pain Score 7    Pain Location Shoulder   Pain Orientation Right            [facility] OT Assessment - 08/23/14 1446    Assessment   Diagnosis Right rotator cuff syndrome   Precautions   Precautions None                OT Treatments/Exercises (OP) - 08/23/14 1447    Exercises   Exercises Shoulder   Shoulder Exercises: Supine   Protraction PROM;5 reps;AAROM;10 reps   Horizontal ABduction PROM;5 reps;AAROM;10 reps   External Rotation PROM;5 reps;AAROM;10 reps   Internal Rotation PROM;5 reps;AAROM;10 reps   Flexion PROM;5 reps;AAROM;10 reps   ABduction PROM;5 reps;AAROM;10 reps   Shoulder Exercises: Seated   Protraction AAROM;10 reps   Horizontal ABduction AAROM;10 reps   External Rotation  AAROM;10 reps   Internal Rotation AAROM;10 reps   Flexion AAROM;10 reps   Abduction AAROM;10 reps   Modalities   Modalities Electrical Stimulation;Moist Heat   Moist Heat Therapy   Number Minutes Moist Heat 10 Minutes   Moist Heat Location Shoulder   Electrical Stimulation   Electrical Stimulation Location right shoulder   Electrical Stimulation Action interferential   Electrical Stimulation Parameters 10.0 volts   Electrical Stimulation Goals Pain   Manual Therapy   Manual Therapy Myofascial release   Myofascial Release Myofascial release to right upper arm, scapular, shoulder, and associate region to decrease pain and restrictions and improve pain free mobility.                  OT Short Term Goals - 08/04/14 1422    OT SHORT TERM GOAL #1   Title Patient will be educated on HEP.    Time 3   Period Weeks   Status On-going   OT SHORT TERM GOAL #2   Title Patient will decrease pain to 2/10 or less with daily tasks.    Time 3   Period Weeks   Status On-going   OT SHORT TERM GOAL #3   Title Patient will increase AROM to [facility] to increase ability to reach into overhead cabinets with less difficulty.  Time 3   Period Weeks   Status On-going   OT SHORT TERM GOAL #4   Title Patient will increase strength to 3+/5 to increase ability to complete cleaning tasks with less difficulty.    Time 3   Period Weeks   Status On-going   OT SHORT TERM GOAL #5   Status --           OT Long Term Goals - 08/04/14 1422    OT LONG TERM GOAL #1   Title Patient will return to highest level of independence with all BADL and IADL tasks.    Time 6   Period Weeks   Status On-going   OT LONG TERM GOAL #2   Title Patient will increase AROM to WNL to increase ability to complete overhead tasks with less difficulty.    Time 6   Period Weeks   Status On-going   OT LONG TERM GOAL #3   Title Patient fascial restrictions in min amount in right arm.    Time 6   Period Weeks   Status  On-going   OT LONG TERM GOAL #4   Title Patient will increase strength to 4-/5 to increase ability to complete housekeeping tasks.    Time 6   Period Weeks   Status On-going               Plan - 08/23/14 1532    Clinical Impression Statement A: Added AAROM exercises while seated.  Pt able to tolerate exercise within pain tolerance.  Pt stated her pain was a 6/10 at the end of session. Mini reassessment not completed this date as patient has only completed 4 visits in 4 weeks. Will wait until recertification is up to reassess.   Plan P: Continue manual therapy and AAROM in sitting exercises   Add wall wash and shoulder elevation, row, extension seated.        Problem List Patient Active Problem List   Diagnosis Date Noted  . Osteoarthritis, knee 11/06/2012  . Contusion of bone 07/23/2012    Ailene Ravel, OTR/L,CBIS  [phone]  08/23/2014, 3:52 PM  [place] [address] [place], [place], [address] Phone: [phone]   Fax:  [phone]

## 2014-08-25 ENCOUNTER — Encounter (HOSPITAL_COMMUNITY): Payer: Self-pay

## 2014-08-25 ENCOUNTER — Ambulatory Visit (HOSPITAL_COMMUNITY): Payer: No Typology Code available for payment source

## 2014-08-25 DIAGNOSIS — R531 Weakness: Secondary | ICD-10-CM

## 2014-08-25 DIAGNOSIS — M25611 Stiffness of right shoulder, not elsewhere classified: Secondary | ICD-10-CM

## 2014-08-25 DIAGNOSIS — M25511 Pain in right shoulder: Secondary | ICD-10-CM

## 2014-08-25 NOTE — Therapy (Addendum)
[place], [place], [address] Phone: [phone]   Fax:  [phone]  Occupational Therapy Treatment  Patient Details  Name: Nancy Espinoza MRN: [id number] Date of Birth: 12-28-1955 Referring Provider:  Carole Civil, MD  Encounter Date: 08/25/2014      OT End of Session - 08/25/14 1503    Visit Number 5   Number of Visits 12   Date for OT Re-Evaluation 09/15/14   Authorization Type [facility] approved 06/10/14-12/09/14   OT Start Time 1430   OT Stop Time 1500   OT Time Calculation (min) 30 min   Activity Tolerance Patient tolerated treatment well   Behavior During Therapy [facility] for tasks assessed/performed      Past Medical History  Diagnosis Date  . Back pain   . Knee pain, bilateral     Past Surgical History  Procedure Laterality Date  . Abdominal hysterectomy      There were no vitals filed for this visit.  Visit Diagnosis:  Pain in joint, shoulder region, right  Decreased range of motion of shoulder, right  Generalized weakness      Subjective Assessment - 08/25/14 1450    Symptoms S: I used my arm a lot like you said to. It hurt a lot. So there.    Currently in Pain? Yes   Pain Score 8    Pain Location Shoulder   Pain Orientation Right   Pain Descriptors / Indicators Aching   Pain Type Acute pain            [facility] OT Assessment - 08/25/14 1453    Assessment   Diagnosis Right rotator cuff syndrome   Precautions   Precautions None                OT Treatments/Exercises (OP) - 08/25/14 1453    Exercises   Exercises Shoulder   Shoulder Exercises: Supine   Protraction PROM;5 reps;AAROM;10 reps   Horizontal ABduction PROM;5 reps;AAROM;10 reps   External Rotation PROM;5 reps;AAROM;10 reps   Internal Rotation PROM;5 reps;AAROM;10 reps   Flexion PROM;5 reps;AAROM;10 reps   ABduction PROM;5 reps;AAROM;10 reps   Shoulder Exercises: Seated   Elevation AROM;10 reps    Row AROM;10 reps   Manual Therapy   Manual Therapy Myofascial release   Myofascial Release Myofascial release to right upper arm, scapular, shoulder, and associate region to decrease pain and restrictions and improve pain free mobility.                  OT Short Term Goals - 08/04/14 1422    OT SHORT TERM GOAL #1   Title Patient will be educated on HEP.    Time 3   Period Weeks   Status On-going   OT SHORT TERM GOAL #2   Title Patient will decrease pain to 2/10 or less with daily tasks.    Time 3   Period Weeks   Status On-going   OT SHORT TERM GOAL #3   Title Patient will increase AROM to [facility] to increase ability to reach into overhead cabinets with less difficulty.    Time 3   Period Weeks   Status On-going   OT SHORT TERM GOAL #4   Title Patient will increase strength to 3+/5 to increase ability to complete cleaning tasks with less difficulty.    Time 3   Period Weeks   Status On-going   OT SHORT TERM GOAL #5   Status --  OT Long Term Goals - 08/04/14 1422    OT LONG TERM GOAL #1   Title Patient will return to highest level of independence with all BADL and IADL tasks.    Time 6   Period Weeks   Status On-going   OT LONG TERM GOAL #2   Title Patient will increase AROM to WNL to increase ability to complete overhead tasks with less difficulty.    Time 6   Period Weeks   Status On-going   OT LONG TERM GOAL #3   Title Patient fascial restrictions in min amount in right arm.    Time 6   Period Weeks   Status On-going   OT LONG TERM GOAL #4   Title Patient will increase strength to 4-/5 to increase ability to complete housekeeping tasks.    Time 6   Period Weeks   Status On-going               Plan - 08/25/14 1504    Clinical Impression Statement A: pt requested to leave session early as she had a prior engagement. Pt continues to have increased pain over the right superior portion of the deltoid close to the acromion process.  Pain continues to be constant and seems to present as a tear of some sort. Discussed how her pain and symptoms are very similar to her previous therapy sessions last year. Previously therapy was able to increase ROM and strength and pain remained constant. PROM is great today as patient was able to achieve [facility]. Added seated elevation and row. Unable to add wall wash and extension due to time constraint.     Plan P: Cont with manual therapy and AAROM sitting. Add wall wash and shoulder extension seated.         Problem List Patient Active Problem List   Diagnosis Date Noted  . Osteoarthritis, knee 11/06/2012  . Contusion of bone 07/23/2012    Ailene Ravel, OTR/L,CBIS  [phone]  08/25/2014, 3:18 PM  [facility] [address] [place], [place], [address] Phone: [phone]   Fax:  [phone]

## 2014-08-30 ENCOUNTER — Encounter (HOSPITAL_COMMUNITY): Payer: Self-pay

## 2014-08-30 ENCOUNTER — Ambulatory Visit (HOSPITAL_COMMUNITY): Payer: No Typology Code available for payment source | Attending: Orthopedic Surgery

## 2014-08-30 DIAGNOSIS — R531 Weakness: Secondary | ICD-10-CM

## 2014-08-30 DIAGNOSIS — M6281 Muscle weakness (generalized): Secondary | ICD-10-CM | POA: Insufficient documentation

## 2014-08-30 DIAGNOSIS — Z5189 Encounter for other specified aftercare: Secondary | ICD-10-CM | POA: Insufficient documentation

## 2014-08-30 DIAGNOSIS — M25511 Pain in right shoulder: Secondary | ICD-10-CM | POA: Insufficient documentation

## 2014-08-30 DIAGNOSIS — M25611 Stiffness of right shoulder, not elsewhere classified: Secondary | ICD-10-CM | POA: Insufficient documentation

## 2014-08-30 NOTE — Therapy (Signed)
[place], [place], [address] Phone: [phone]   Fax:  [phone]  Occupational Therapy Treatment  Patient Details  Name: Nancy Espinoza MRN: [id number] Date of Birth: 11/25/1955 Referring Provider:  Carole Civil, MD  Encounter Date: 08/30/2014      OT End of Session - 08/30/14 1609    Visit Number 6   Number of Visits 12   Date for OT Re-Evaluation 09/15/14   Authorization Type [facility] approved 06/10/14-12/09/14   OT Start Time 1430   OT Stop Time 1515   OT Time Calculation (min) 45 min   Activity Tolerance Patient tolerated treatment well   Behavior During Therapy [facility] for tasks assessed/performed      Past Medical History  Diagnosis Date  . Back pain   . Knee pain, bilateral     Past Surgical History  Procedure Laterality Date  . Abdominal hysterectomy      There were no vitals filed for this visit.  Visit Diagnosis:  Pain in joint, shoulder region, right  Decreased range of motion of shoulder, right  Generalized weakness      Subjective Assessment - 08/30/14 1607    Subjective  S: Pt stated "I think something is torn in there."   Currently in Pain? Yes   Pain Score 8    Pain Location Shoulder   Pain Orientation Right   Pain Descriptors / Indicators Aching            [facility] OT Assessment - 08/30/14 1437    Assessment   Diagnosis Right rotator cuff syndrome   Precautions   Precautions None                OT Treatments/Exercises (OP) - 08/30/14 1438    Exercises   Exercises Shoulder   Shoulder Exercises: Supine   Protraction PROM;5 reps;AAROM;10 reps   Horizontal ABduction PROM;5 reps;AAROM;10 reps   External Rotation PROM;5 reps;AAROM;10 reps   Internal Rotation PROM;5 reps;AAROM;10 reps   Flexion PROM;5 reps;AAROM;10 reps   ABduction PROM;5 reps;AAROM;10 reps   Shoulder Exercises: Seated   Elevation AROM;10 reps   Protraction AAROM;10 reps   Horizontal ABduction AAROM;10 reps   External Rotation AAROM;10 reps   Internal Rotation AAROM;10 reps   Flexion AAROM;10 reps   Abduction AAROM;10 reps   Manual Therapy   Manual Therapy Myofascial release   Myofascial Release Myofascial release to right upper arm, scapular, shoulder, and associate region to decrease pain and restrictions and improve pain free mobility.                  OT Short Term Goals - 08/04/14 1422    OT SHORT TERM GOAL #1   Title Patient will be educated on HEP.    Time 3   Period Weeks   Status On-going   OT SHORT TERM GOAL #2   Title Patient will decrease pain to 2/10 or less with daily tasks.    Time 3   Period Weeks   Status On-going   OT SHORT TERM GOAL #3   Title Patient will increase AROM to [facility] to increase ability to reach into overhead cabinets with less difficulty.    Time 3   Period Weeks   Status On-going   OT SHORT TERM GOAL #4   Title Patient will increase strength to 3+/5 to increase ability to complete cleaning tasks with less difficulty.    Time 3   Period Weeks  Status On-going   OT SHORT TERM GOAL #5   Status --           OT Long Term Goals - 08/04/14 1422    OT LONG TERM GOAL #1   Title Patient will return to highest level of independence with all BADL and IADL tasks.    Time 6   Period Weeks   Status On-going   OT LONG TERM GOAL #2   Title Patient will increase AROM to WNL to increase ability to complete overhead tasks with less difficulty.    Time 6   Period Weeks   Status On-going   OT LONG TERM GOAL #3   Title Patient fascial restrictions in min amount in right arm.    Time 6   Period Weeks   Status On-going   OT LONG TERM GOAL #4   Title Patient will increase strength to 4-/5 to increase ability to complete housekeeping tasks.    Time 6   Period Weeks   Status On-going               Plan - 08/30/14 1610    Clinical Impression Statement A: Added AAROM seated.  Unable to perform AROM  extension and row, and wall wash due to time constraint.  Pt continues to show signs of pain throughout session, however, pt tolerated exercises well.  Pt requested a copy of her last treatment note .   Plan P: Continue manual therapy.  Add AROM extension while seated due to being unable to add during this visit.        Problem List Patient Active Problem List   Diagnosis Date Noted  . Osteoarthritis, knee 11/06/2012  . Contusion of bone 07/23/2012    Elba Barman, OTA Student [phone] 08/30/2014, 4:16 PM  [facility] [address] [place], [place], [address] Phone: [phone]   Fax:  [phone]

## 2014-09-01 ENCOUNTER — Encounter (HOSPITAL_COMMUNITY): Payer: Self-pay

## 2014-09-01 ENCOUNTER — Ambulatory Visit (HOSPITAL_COMMUNITY): Payer: No Typology Code available for payment source

## 2014-09-01 DIAGNOSIS — M25511 Pain in right shoulder: Secondary | ICD-10-CM

## 2014-09-01 DIAGNOSIS — M25611 Stiffness of right shoulder, not elsewhere classified: Secondary | ICD-10-CM

## 2014-09-01 DIAGNOSIS — R531 Weakness: Secondary | ICD-10-CM

## 2014-09-01 NOTE — Therapy (Signed)
[place], [place], [address] Phone: [phone]   Fax:  [phone]  Occupational Therapy Treatment  Patient Details  Name: Nancy Espinoza MRN: [id number] Date of Birth: 1956/05/07 Referring Provider:  Carole Civil, MD  Encounter Date: 09/01/2014      OT End of Session - 09/01/14 1529    Visit Number 7   Number of Visits 12   Date for OT Re-Evaluation 09/15/14   Authorization Type [facility] approved 06/10/14-12/09/14   OT Start Time 1430   OT Stop Time 1533   OT Time Calculation (min) 63 min   Activity Tolerance Patient tolerated treatment well   Behavior During Therapy [facility] for tasks assessed/performed      Past Medical History  Diagnosis Date  . Back pain   . Knee pain, bilateral     Past Surgical History  Procedure Laterality Date  . Abdominal hysterectomy      There were no vitals filed for this visit.  Visit Diagnosis:  Pain in joint, shoulder region, right  Decreased range of motion of shoulder, right  Generalized weakness      Subjective Assessment - 09/01/14 1527    Subjective  S: Pt stated "It hurts when I do higher exercises rather than lower exerciss."   Currently in Pain? Yes   Pain Score 8    Pain Location Shoulder   Pain Orientation Right   Pain Descriptors / Indicators Aching   Pain Type Acute pain            [facility] OT Assessment - 09/01/14 1433    Assessment   Diagnosis Right rotator cuff syndrome   Precautions   Precautions None                OT Treatments/Exercises (OP) - 09/01/14 1433    Exercises   Exercises Shoulder   Shoulder Exercises: Supine   Protraction PROM;5 reps;AROM;10 reps   Horizontal ABduction PROM;5 reps;AROM;10 reps   External Rotation PROM;5 reps;AROM;10 reps   Internal Rotation PROM;5 reps;AROM;10 reps   Flexion PROM;5 reps;AROM;10 reps   ABduction PROM;5 reps;AROM;10 reps   Shoulder Exercises: Seated   Elevation  AROM;10 reps   Extension AROM;10 reps   Row AROM;10 reps   Protraction AROM;10 reps   Shoulder Exercises: ROM/Strengthening   Wall Wash 1'   Modalities   Modalities Electrical Stimulation;Moist Heat   Moist Heat Therapy   Number Minutes Moist Heat 10 Minutes   Moist Heat Location Shoulder   Electrical Stimulation   Electrical Stimulation Location right shoulder   Electrical Stimulation Action interferential   Electrical Stimulation Parameters 13.5 volts   Electrical Stimulation Goals Pain   Manual Therapy   Manual Therapy Myofascial release   Myofascial Release Myofascial release to right upper arm, scapular, shoulder, and associate region to decrease pain and restrictions and improve pain free mobility.                  OT Short Term Goals - 08/04/14 1422    OT SHORT TERM GOAL #1   Title Patient will be educated on HEP.    Time 3   Period Weeks   Status On-going   OT SHORT TERM GOAL #2   Title Patient will decrease pain to 2/10 or less with daily tasks.    Time 3   Period Weeks   Status On-going   OT SHORT TERM GOAL #3   Title Patient will increase AROM to  WFL to increase ability to reach into overhead cabinets with less difficulty.    Time 3   Period Weeks   Status On-going   OT SHORT TERM GOAL #4   Title Patient will increase strength to 3+/5 to increase ability to complete cleaning tasks with less difficulty.    Time 3   Period Weeks   Status On-going   OT SHORT TERM GOAL #5   Status --           OT Long Term Goals - 08/04/14 1422    OT LONG TERM GOAL #1   Title Patient will return to highest level of independence with all BADL and IADL tasks.    Time 6   Period Weeks   Status On-going   OT LONG TERM GOAL #2   Title Patient will increase AROM to WNL to increase ability to complete overhead tasks with less difficulty.    Time 6   Period Weeks   Status On-going   OT LONG TERM GOAL #3   Title Patient fascial restrictions in min amount in right  arm.    Time 6   Period Weeks   Status On-going   OT LONG TERM GOAL #4   Title Patient will increase strength to 4-/5 to increase ability to complete housekeeping tasks.    Time 6   Period Weeks   Status On-going               Plan - 09/01/14 1537    Clinical Impression Statement A: Added AROM seated for extension and wall wash exercise.  Pt continues to experience pain in right anterior aspect of shoulder and limits pt's ability to perform ROM for exercises requiring the arm to go above shoulder height.  Pt tolerated exercises well.  Pt increased to AROM exercises in supine and seated.   Plan P: Take measurements 09/06/14. Perform AROM seated for shoulder exercises as tolerable. Increase repetitions as tolerable for AROM seated extension/elevation/row.        Problem List Patient Active Problem List   Diagnosis Date Noted  . Osteoarthritis, knee 11/06/2012  . Contusion of bone 07/23/2012    Elba Barman, OTA Student [phone] 09/01/2014, 3:49 PM  [place] [address] [place], [place], [address] Phone: [phone]   Fax:  [phone]

## 2014-09-02 ENCOUNTER — Ambulatory Visit: Payer: No Typology Code available for payment source | Admitting: Orthopedic Surgery

## 2014-09-06 ENCOUNTER — Ambulatory Visit (HOSPITAL_COMMUNITY): Payer: No Typology Code available for payment source

## 2014-09-07 ENCOUNTER — Ambulatory Visit: Payer: Self-pay | Admitting: Orthopedic Surgery

## 2014-09-08 ENCOUNTER — Ambulatory Visit (HOSPITAL_COMMUNITY): Payer: No Typology Code available for payment source

## 2014-09-23 ENCOUNTER — Ambulatory Visit: Payer: No Typology Code available for payment source | Admitting: Orthopedic Surgery

## 2014-09-27 ENCOUNTER — Encounter (HOSPITAL_COMMUNITY): Payer: Self-pay

## 2014-09-27 NOTE — Therapy (Signed)
[place], [place], [address] Phone: [phone]   Fax:  [phone]  Patient Details  Name: Nancy Espinoza MRN: [id number] Date of Birth: 10-23-1955 Referring Provider:  No ref. provider found  Encounter Date: 09/27/2014 OCCUPATIONAL THERAPY DISCHARGE SUMMARY  Visits from Start of Care: 7  Current functional level related to goals / functional outcomes: OT LONG TERM GOAL #1    Title Patient will return to highest level of independence with all BADL and IADL tasks.    Time 6   Period Weeks   Status On-going   OT LONG TERM GOAL #2   Title Patient will increase AROM to WNL to increase ability to complete overhead tasks with less difficulty.    Time 6   Period Weeks   Status On-going   OT LONG TERM GOAL #3   Title Patient fascial restrictions in min amount in right arm.    Time 6   Period Weeks   Status On-going   OT LONG TERM GOAL #4   Title Patient will increase strength to 4-/5 to increase ability to complete housekeeping tasks.    Time 6   Period Weeks   Status On-going          Remaining deficits: Pt is being discharged due to not returning to clinic since 09/01/14. Pt was up for a reassessment with the plan to be discharged due to lack of progress.     Plan: Patient agrees to discharge.  Patient goals were not met. Patient is being discharged due to not returning since the last visit.  ?????       Ailene Ravel, OTR/L,CBIS  [phone]  09/27/2014, 11:48 AM  [place], [place], [address] Phone: [phone]   Fax:  [phone]

## 2014-09-30 ENCOUNTER — Encounter: Payer: Self-pay | Admitting: Orthopedic Surgery

## 2014-09-30 ENCOUNTER — Ambulatory Visit (INDEPENDENT_AMBULATORY_CARE_PROVIDER_SITE_OTHER): Payer: No Typology Code available for payment source | Admitting: Orthopedic Surgery

## 2014-09-30 VITALS — BP 128/82 | Ht 67.0 in | Wt 182.6 lb

## 2014-09-30 DIAGNOSIS — M75101 Unspecified rotator cuff tear or rupture of right shoulder, not specified as traumatic: Secondary | ICD-10-CM

## 2014-09-30 MED ORDER — HYDROCODONE-ACETAMINOPHEN 5-325 MG PO TABS: 1.0000 | ORAL_TABLET | Freq: Four times a day (QID) | ORAL | Status: DC | PRN

## 2014-09-30 MED ORDER — DIAZEPAM 10 MG PO TABS: ORAL_TABLET | ORAL | Status: DC

## 2014-09-30 NOTE — Patient Instructions (Signed)
We will schedule MRI w/ Arthrogram and call you with results

## 2014-09-30 NOTE — Progress Notes (Signed)
Patient ID: Nancy Espinoza, female   DOB: [birth year], 59 y.o.   MRN: [id number] Chief Complaint  Patient presents with  . Follow-up    7 week follow up right arm s/p PT    Patient with diagnosis rotator cuff syndrome treated with physical therapy and subacromial injection and Norco 5 mg. I believe she also had some anti-inflammatories which didn't help. She comes in today complaining of continued painful [place] elevation.  Review of systems negative   BP 128/82 mmHg  Ht 5\' 7"  (1.702 m)  Wt 182 lb 9.6 oz (82.827 kg)  BMI 28.59 kg/m2 She is awake alert and oriented 3 mood and affect are normal  She has painful forward elevation of the right arm and shoulder with active range of motion 120 of forward elevation in the scapular plane. She has weaknessestendon grade 4 Internal/external rotation strength grade 5 Positive impingement sign Negative apprehension sign . Acromial tenderness posterior subacromial space Neurovascular exam intact   MRI with contrast rule out rotator cuff tear right shoulder continue Norco 5 mg for pain

## 2014-10-06 ENCOUNTER — Other Ambulatory Visit: Payer: Self-pay | Admitting: *Deleted

## 2014-10-06 DIAGNOSIS — M75101 Unspecified rotator cuff tear or rupture of right shoulder, not specified as traumatic: Secondary | ICD-10-CM

## 2014-10-20 ENCOUNTER — Ambulatory Visit (HOSPITAL_COMMUNITY): Payer: No Typology Code available for payment source

## 2014-10-20 ENCOUNTER — Other Ambulatory Visit (HOSPITAL_COMMUNITY): Payer: No Typology Code available for payment source

## 2014-10-27 ENCOUNTER — Telehealth: Payer: Self-pay

## 2014-10-27 NOTE — Telephone Encounter (Signed)
Pt was referred by [facility] for screening colonoscopy. LMOM for a return call.

## 2014-10-28 ENCOUNTER — Other Ambulatory Visit: Payer: Self-pay | Admitting: *Deleted

## 2014-10-28 ENCOUNTER — Telehealth: Payer: Self-pay | Admitting: Orthopedic Surgery

## 2014-10-28 MED ORDER — HYDROCODONE-ACETAMINOPHEN 5-325 MG PO TABS: 1.0000 | ORAL_TABLET | Freq: Four times a day (QID) | ORAL | Status: DC | PRN

## 2014-10-28 NOTE — Telephone Encounter (Signed)
Patient picked up Rx

## 2014-10-28 NOTE — Telephone Encounter (Signed)
Call from patient, requests refill of medication: HYDROcodone-acetaminophen (NORCO/VICODIN) 5-325 MG per tablet  - patient ph# [phone]

## 2014-10-28 NOTE — Telephone Encounter (Signed)
Prescription available, patient aware  

## 2014-11-01 NOTE — Telephone Encounter (Signed)
Tried to call pt to schedule colonoscopy. LMOM for a return call.

## 2014-11-02 NOTE — Telephone Encounter (Addendum)
I called pt and she has some constipation. Has not had a good BM in 3 days. Scheduled an OV with Walden Field, NP on 11/17/2014 at 2:00 AM.

## 2014-11-05 ENCOUNTER — Other Ambulatory Visit (HOSPITAL_COMMUNITY): Payer: No Typology Code available for payment source

## 2014-11-10 ENCOUNTER — Ambulatory Visit (HOSPITAL_COMMUNITY): Payer: No Typology Code available for payment source | Attending: Orthopedic Surgery

## 2014-11-10 ENCOUNTER — Inpatient Hospital Stay (HOSPITAL_COMMUNITY): Admission: RE | Admit: 2014-11-10 | Payer: No Typology Code available for payment source | Source: Ambulatory Visit

## 2014-11-12 ENCOUNTER — Other Ambulatory Visit: Payer: Self-pay | Admitting: Orthopedic Surgery

## 2014-11-12 DIAGNOSIS — M75101 Unspecified rotator cuff tear or rupture of right shoulder, not specified as traumatic: Secondary | ICD-10-CM

## 2014-11-17 ENCOUNTER — Ambulatory Visit (INDEPENDENT_AMBULATORY_CARE_PROVIDER_SITE_OTHER): Payer: No Typology Code available for payment source | Admitting: Nurse Practitioner

## 2014-11-17 ENCOUNTER — Encounter: Payer: Self-pay | Admitting: Nurse Practitioner

## 2014-11-17 ENCOUNTER — Other Ambulatory Visit: Payer: Self-pay

## 2014-11-17 VITALS — BP 137/99 | HR 97 | Temp 98.7°F | Ht 66.0 in | Wt 179.8 lb

## 2014-11-17 DIAGNOSIS — Z1211 Encounter for screening for malignant neoplasm of colon: Secondary | ICD-10-CM

## 2014-11-17 NOTE — Assessment & Plan Note (Signed)
Patient due for initial screening colonoscopy, referred by PCP. Is generally a symptomatically GI standpoint. Was having some constipation however since stopping her pain medication this constipation is resolved on its own. Advised her to call if she had any further problems related to her constipation otherwise she can take a daily stool softener if she needs to restart her pain medicines for prevention of constipation. At this point we'll proceed with this nice lady's initial screening colonoscopy.  Proceed with colonoscopy with Dr. Oneida Alar in the OR with propofol/MAC in the near future. The risks, benefits, and alternatives have been discussed in detail with the patient. They state understanding and desire to proceed.    The patient is not on any anticoagulants. She does take clonazepam, chronic pain meds, and Paxil antidepressant. Has a history of heavy alcohol use of a sixpack of beer a day however she has not had any alcohol in the past 3 years. Denies relation drug use. We will arrange for the procedure and the OR with propofol to ensure adequate sedation.

## 2014-11-17 NOTE — Progress Notes (Signed)
    Primary Care Physician:  No PCP Per Patient Primary Gastroenterologist:  Dr. Oneida Alar  Chief Complaint  Patient presents with  . set up TCS    HPI:   59 year old female presents for initial screening colonoscopy referred by PCP. PCP notes reviewed in entirety. Patient was not a candidate for phone triage due to having constipation. Today she states when she cut back on the pain medication her constipation resolved. Deneis abdominal pain, N/V, hematemesis, melena. Denies chest pain, dyspnea, dizziness, lightheadedness, syncope, near syncope. Denies any other upper or lower GI symptoms.  Past Medical History  Diagnosis Date  . Back pain   . Knee pain, bilateral   . Anxiety     Past Surgical History  Procedure Laterality Date  . Abdominal hysterectomy      Current Outpatient Prescriptions  Medication Sig Dispense Refill  . Cholecalciferol (VITAMIN D3) 3000 UNITS TABS Take 2,000 Units by mouth daily.    . clonazePAM (KLONOPIN) 1 MG tablet Take 1 mg by mouth 3 (three) times daily.     Marland Kitchen HYDROcodone-acetaminophen (NORCO/VICODIN) 5-325 MG per tablet Take 1 tablet by mouth every 6 (six) hours as needed for moderate pain. 120 tablet 0  . ibuprofen (ADVIL,MOTRIN) 800 MG tablet Take 1 tablet (800 mg total) by mouth 3 (three) times daily. 90 tablet 5  . PARoxetine (PAXIL) 20 MG tablet Take 40 mg by mouth daily.      No current facility-administered medications for this visit.    Allergies as of 11/17/2014 - Review Complete 11/17/2014  Allergen Reaction Noted  . Codeine  07/23/2012  . Penicillins  06/08/2012    Family History  Problem Relation Age of Onset  . Colon cancer Neg Hx     History   Social History  . Marital Status: Married    Spouse Name: N/A  . Number of Children: N/A  . Years of Education: N/A   Occupational History  . Not on file.   Social History Main Topics  . Smoking status: Current Every Day Smoker -- 1.00 packs/day for 40 years    Types: Cigarettes    . Smokeless tobacco: Never Used  . Alcohol Use: No     Comment: Stopped 3 years ago, previously drank 6-pack a night.  . Drug Use: No  . Sexual Activity: Not on file   Other Topics Concern  . Not on file   Social History Narrative    Review of Systems: 10 point ROS negative except as per HPI    Physical Exam: BP 137/99 mmHg  Pulse 97  Temp(Src) 98.7 F (37.1 C)  Ht 5\' 6"  (1.676 m)  Wt 179 lb 12.8 oz (81.557 kg)  BMI 29.03 kg/m2 General:   Alert and oriented. Pleasant and cooperative. Well-nourished and well-developed.  Head:  Normocephalic and atraumatic. Eyes:  Without icterus, sclera clear and conjunctiva pink.  Ears:  Normal auditory acuity. Cardiovascular:  S1, S2 present without murmurs appreciated. Normal pulses noted. Extremities without clubbing or edema. Respiratory:  Clear to auscultation bilaterally. No wheezes, rales, or rhonchi. No distress.  Gastrointestinal:  +BS, soft, non-tender and non-distended. No HSM noted. No guarding or rebound. No masses appreciated.  Rectal:  Deferred  Neurologic:  Alert and oriented x4;  grossly normal neurologically. Psych:  Normal mood and affect. Heme/Lymph/Immune: No significant cervical adenopathy. No excessive bruising noted.    11/17/2014 2:34 PM

## 2014-11-17 NOTE — Patient Instructions (Signed)
1. We'll schedule your procedure for you.  2. Further recommendations to be based on results your procedure.

## 2014-11-17 NOTE — Progress Notes (Signed)
No pcp

## 2014-11-25 ENCOUNTER — Ambulatory Visit (HOSPITAL_COMMUNITY)
Admission: RE | Admit: 2014-11-25 | Discharge: 2014-11-25 | Disposition: A | Discharge status: Home / Self Care | Payer: Self-pay | Source: Ambulatory Visit | Attending: Orthopedic Surgery | Admitting: Orthopedic Surgery

## 2014-11-25 ENCOUNTER — Other Ambulatory Visit: Payer: Self-pay | Admitting: Orthopedic Surgery

## 2014-11-25 ENCOUNTER — Inpatient Hospital Stay (HOSPITAL_COMMUNITY): Admission: RE | Admit: 2014-11-25 | Payer: No Typology Code available for payment source | Source: Ambulatory Visit

## 2014-11-25 DIAGNOSIS — M75101 Unspecified rotator cuff tear or rupture of right shoulder, not specified as traumatic: Secondary | ICD-10-CM

## 2014-11-25 DIAGNOSIS — M25511 Pain in right shoulder: Secondary | ICD-10-CM | POA: Insufficient documentation

## 2014-11-25 DIAGNOSIS — M7581 Other shoulder lesions, right shoulder: Secondary | ICD-10-CM | POA: Insufficient documentation

## 2014-11-25 MED ORDER — LIDOCAINE HCL (PF) 1 % IJ SOLN
INTRAMUSCULAR | Status: AC
  Filled 2014-11-25: qty 10

## 2014-11-25 MED ORDER — POVIDONE-IODINE 10 % EX SOLN
CUTANEOUS | Status: AC
  Filled 2014-11-25: qty 15

## 2014-11-25 MED ORDER — IOHEXOL 300 MG/ML  SOLN
50.0000 mL | Freq: Once | INTRAMUSCULAR | Status: AC | PRN
  Administered 2014-11-25: 20 mL via INTRAVENOUS

## 2014-11-25 MED ORDER — GADOBENATE DIMEGLUMINE 529 MG/ML IV SOLN
5.0000 mL | Freq: Once | INTRAVENOUS | Status: AC | PRN
  Administered 2014-11-25: 0.05 mL via INTRAVENOUS

## 2014-11-26 ENCOUNTER — Telehealth: Payer: Self-pay | Admitting: Orthopedic Surgery

## 2014-11-26 NOTE — Telephone Encounter (Signed)
Phone   Left message she will call t or w

## 2014-11-30 ENCOUNTER — Telehealth: Payer: Self-pay | Admitting: Orthopedic Surgery

## 2014-11-30 NOTE — Telephone Encounter (Signed)
She can be reached at [phone]

## 2014-11-30 NOTE — Telephone Encounter (Addendum)
Patient returned call - (1)states Dr Aline Brochure called her over the holiday weekend regarding MRI results, and that she was to call back regarding treatment / follow-up.                           And - (2)states needs refill of pain medication, said arm hurts, hasn't been able to raise up; had been to therapy.  Medication is: HYDROcodone-acetaminophen (NORCO/VICODIN) 5-325 MG per tablet [[id number]] - ph# [phone]

## 2014-11-30 NOTE — Telephone Encounter (Signed)
Routing to Dr Harrison 

## 2014-12-01 ENCOUNTER — Other Ambulatory Visit: Payer: Self-pay | Admitting: *Deleted

## 2014-12-01 MED ORDER — HYDROCODONE-ACETAMINOPHEN 5-325 MG PO TABS: 1.0000 | ORAL_TABLET | Freq: Three times a day (TID) | ORAL | Status: DC | PRN

## 2014-12-01 MED ORDER — PREDNISONE 10 MG PO TABS: 10.0000 mg | ORAL_TABLET | Freq: Three times a day (TID) | ORAL | Status: DC | PRN

## 2014-12-01 NOTE — Telephone Encounter (Signed)
Patient aware.

## 2014-12-01 NOTE — Telephone Encounter (Signed)
Prescription for norco 5 Q 8 hrs # 45  And 10 mg prednisone q8 # 90 NR   If she asks I ll call her to explain MRI which shows no tear just inflammation

## 2014-12-07 NOTE — Patient Instructions (Signed)
Avleen Bordwell Lembcke  12/07/2014     @PREFPERIOPPHARMACY @   Your procedure is scheduled on 12/14/2014.  Report to [facility] at 7:00 A.M.  Call this number if you have problems the morning of surgery:  [phone]   Remember:  Do not eat food or drink liquids after midnight.  Take these medicines the morning of surgery with A SIP OF WATER Klonopin, Hydrocodone, Paxil   Do not wear jewelry, make-up or nail polish.  Do not wear lotions, powders, or perfumes.  You may wear deodorant.  Do not shave 48 hours prior to surgery.  Men may shave face and neck.  Do not bring valuables to the hospital.  [facility] is not responsible for any belongings or valuables.  Contacts, dentures or bridgework may not be worn into surgery.  Leave your suitcase in the car.  After surgery it may be brought to your room.  For patients admitted to the hospital, discharge time will be determined by your treatment team.  Patients discharged the day of surgery will not be allowed to drive home.    Please read over the following fact sheets that you were given. Anesthesia Post-op Instructions     PATIENT INSTRUCTIONS POST-ANESTHESIA  IMMEDIATELY FOLLOWING SURGERY:  Do not drive or operate machinery for the first twenty four hours after surgery.  Do not make any important decisions for twenty four hours after surgery or while taking narcotic pain medications or sedatives.  If you develop intractable nausea and vomiting or a severe headache please notify your doctor immediately.  FOLLOW-UP:  Please make an appointment with your surgeon as instructed. You do not need to follow up with anesthesia unless specifically instructed to do so.  WOUND CARE INSTRUCTIONS (if applicable):  Keep a dry clean dressing on the anesthesia/puncture wound site if there is drainage.  Once the wound has quit draining you may leave it open to air.  Generally you should leave the bandage intact for twenty four hours unless there is  drainage.  If the epidural site drains for more than 36-48 hours please call the anesthesia department.  QUESTIONS?:  Please feel free to call your physician or the hospital operator if you have any questions, and they will be happy to assist you.      Colonoscopy A colonoscopy is an exam to look at the entire large intestine (colon). This exam can help find problems such as tumors, polyps, inflammation, and areas of bleeding. The exam takes about 1 hour.  LET [facility] CARE PROVIDER KNOW ABOUT:   Any allergies you have.  All medicines you are taking, including vitamins, herbs, eye drops, creams, and over-the-counter medicines.  Previous problems you or members of your family have had with the use of anesthetics.  Any blood disorders you have.  Previous surgeries you have had.  Medical conditions you have. RISKS AND COMPLICATIONS  Generally, this is a safe procedure. However, as with any procedure, complications can occur. Possible complications include:  Bleeding.  Tearing or rupture of the colon wall.  Reaction to medicines given during the exam.  Infection (rare). BEFORE THE PROCEDURE   Ask your health care provider about changing or stopping your regular medicines.  You may be prescribed an oral bowel prep. This involves drinking a large amount of medicated liquid, starting the day before your procedure. The liquid will cause you to have multiple loose stools until your stool is almost clear or light green. This cleans out your colon in  preparation for the procedure.  Do not eat or drink anything else once you have started the bowel prep, unless your health care provider tells you it is safe to do so.  Arrange for someone to drive you home after the procedure. PROCEDURE   You will be given medicine to help you relax (sedative).  You will lie on your side with your knees bent.  A long, flexible tube with a light and camera on the end (colonoscope) will be inserted  through the rectum and into the colon. The camera sends video back to a computer screen as it moves through the colon. The colonoscope also releases carbon dioxide gas to inflate the colon. This helps your health care provider see the area better.  During the exam, your health care provider may take a small tissue sample (biopsy) to be examined under a microscope if any abnormalities are found.  The exam is finished when the entire colon has been viewed. AFTER THE PROCEDURE   Do not drive for 24 hours after the exam.  You may have a small amount of blood in your stool.  You may pass moderate amounts of gas and have mild abdominal cramping or bloating. This is caused by the gas used to inflate your colon during the exam.  Ask when your test results will be ready and how you will get your results. Make sure you get your test results. Document Released: 05/11/2000 Document Revised: 03/04/2013 Document Reviewed: 01/19/2013 [facility] Patient Information 2015 [place], [place]. This information is not intended to replace advice given to you by your health care provider. Make sure you discuss any questions you have with your health care provider.

## 2014-12-08 ENCOUNTER — Encounter (HOSPITAL_COMMUNITY)
Admission: RE | Admit: 2014-12-08 | Discharge: 2014-12-08 | Disposition: A | Discharge status: Home / Self Care | Payer: No Typology Code available for payment source | Source: Ambulatory Visit | Attending: Gastroenterology | Admitting: Gastroenterology

## 2014-12-08 ENCOUNTER — Telehealth: Payer: Self-pay | Admitting: General Practice

## 2014-12-08 NOTE — Telephone Encounter (Signed)
I received a call from day surgery at [facility] stating the patient no showed for her pre-operative visit this morning. They made several attempts to reach her, however they were unsuccessful.  Routing to the clinical pool for f/u

## 2014-12-08 NOTE — Telephone Encounter (Signed)
Called and spoke with pt and she is aware her pre-op appt on 12/10/2014  2:45pm

## 2014-12-09 NOTE — Patient Instructions (Signed)
TALAYA LAMPRECHT  12/09/2014     @PREFPERIOPPHARMACY @   Your procedure is scheduled on 12/14/2014  Report to [facility] at 7:00 A.M.  Call this number if you have problems the morning of surgery:  [phone]   Remember:  Do not eat food or drink liquids after midnight.  Take these medicines the morning of surgery with A SIP OF WATER Klonopin, Hydrocodone, Paxil   Do not wear jewelry, make-up or nail polish.  Do not wear lotions, powders, or perfumes.  You may wear deodorant.  Do not shave 48 hours prior to surgery.  Men may shave face and neck.  Do not bring valuables to the hospital.  [facility] is not responsible for any belongings or valuables.  Contacts, dentures or bridgework may not be worn into surgery.  Leave your suitcase in the car.  After surgery it may be brought to your room.  For patients admitted to the hospital, discharge time will be determined by your treatment team.  Patients discharged the day of surgery will not be allowed to drive home.    Please read over the following fact sheets that you were given. Anesthesia Post-op Instructions     PATIENT INSTRUCTIONS POST-ANESTHESIA  IMMEDIATELY FOLLOWING SURGERY:  Do not drive or operate machinery for the first twenty four hours after surgery.  Do not make any important decisions for twenty four hours after surgery or while taking narcotic pain medications or sedatives.  If you develop intractable nausea and vomiting or a severe headache please notify your doctor immediately.  FOLLOW-UP:  Please make an appointment with your surgeon as instructed. You do not need to follow up with anesthesia unless specifically instructed to do so.  WOUND CARE INSTRUCTIONS (if applicable):  Keep a dry clean dressing on the anesthesia/puncture wound site if there is drainage.  Once the wound has quit draining you may leave it open to air.  Generally you should leave the bandage intact for twenty four hours unless there is  drainage.  If the epidural site drains for more than 36-48 hours please call the anesthesia department.  QUESTIONS?:  Please feel free to call your physician or the hospital operator if you have any questions, and they will be happy to assist you.      Colonoscopy A colonoscopy is an exam to look at the entire large intestine (colon). This exam can help find problems such as tumors, polyps, inflammation, and areas of bleeding. The exam takes about 1 hour.  LET [facility] CARE PROVIDER KNOW ABOUT:   Any allergies you have.  All medicines you are taking, including vitamins, herbs, eye drops, creams, and over-the-counter medicines.  Previous problems you or members of your family have had with the use of anesthetics.  Any blood disorders you have.  Previous surgeries you have had.  Medical conditions you have. RISKS AND COMPLICATIONS  Generally, this is a safe procedure. However, as with any procedure, complications can occur. Possible complications include:  Bleeding.  Tearing or rupture of the colon wall.  Reaction to medicines given during the exam.  Infection (rare). BEFORE THE PROCEDURE   Ask your health care provider about changing or stopping your regular medicines.  You may be prescribed an oral bowel prep. This involves drinking a large amount of medicated liquid, starting the day before your procedure. The liquid will cause you to have multiple loose stools until your stool is almost clear or light green. This cleans out your colon in  preparation for the procedure.  Do not eat or drink anything else once you have started the bowel prep, unless your health care provider tells you it is safe to do so.  Arrange for someone to drive you home after the procedure. PROCEDURE   You will be given medicine to help you relax (sedative).  You will lie on your side with your knees bent.  A long, flexible tube with a light and camera on the end (colonoscope) will be inserted  through the rectum and into the colon. The camera sends video back to a computer screen as it moves through the colon. The colonoscope also releases carbon dioxide gas to inflate the colon. This helps your health care provider see the area better.  During the exam, your health care provider may take a small tissue sample (biopsy) to be examined under a microscope if any abnormalities are found.  The exam is finished when the entire colon has been viewed. AFTER THE PROCEDURE   Do not drive for 24 hours after the exam.  You may have a small amount of blood in your stool.  You may pass moderate amounts of gas and have mild abdominal cramping or bloating. This is caused by the gas used to inflate your colon during the exam.  Ask when your test results will be ready and how you will get your results. Make sure you get your test results. Document Released: 05/11/2000 Document Revised: 03/04/2013 Document Reviewed: 01/19/2013 [facility] Patient Information 2015 [place], [place]. This information is not intended to replace advice given to you by your health care provider. Make sure you discuss any questions you have with your health care provider.

## 2014-12-10 ENCOUNTER — Encounter (HOSPITAL_COMMUNITY)
Admission: RE | Admit: 2014-12-10 | Discharge: 2014-12-10 | Disposition: A | Discharge status: Home / Self Care | Payer: Self-pay | Source: Ambulatory Visit | Attending: Gastroenterology | Admitting: Gastroenterology

## 2014-12-10 NOTE — Pre-Procedure Instructions (Signed)
Patient in for PAT. Patient states, " I have a lot of stuff going on that I need to handle and I am going to have to reschedule this procedure".  Dr Oneida Alar office closed. Left them a message concerning what patient said in the general mailbox. Also gave patient Dr Oneida Alar and the OR scheduler's number so she could cancel the procedure with them.

## 2014-12-13 ENCOUNTER — Telehealth: Payer: Self-pay

## 2014-12-13 NOTE — Telephone Encounter (Signed)
Pt went to pre-op appt on 12/10/2014 and told staff (KIM)that she wanted to cancel her procedure and will call the office to reschedule.

## 2014-12-14 ENCOUNTER — Encounter (HOSPITAL_COMMUNITY): Admission: RE | Payer: Self-pay | Source: Ambulatory Visit

## 2014-12-14 ENCOUNTER — Ambulatory Visit (HOSPITAL_COMMUNITY)
Admission: RE | Admit: 2014-12-14 | Payer: No Typology Code available for payment source | Source: Ambulatory Visit | Admitting: Gastroenterology

## 2014-12-14 SURGERY — COLONOSCOPY WITH PROPOFOL
Anesthesia: Monitor Anesthesia Care

## 2014-12-14 NOTE — Telephone Encounter (Signed)
REVIEWED-NO ADDITIONAL RECOMMENDATIONS. 

## 2015-01-03 ENCOUNTER — Telehealth: Payer: Self-pay

## 2015-01-03 ENCOUNTER — Other Ambulatory Visit: Payer: Self-pay

## 2015-01-03 NOTE — Telephone Encounter (Signed)
Pt came by office today and wanted to reschedule her TCS. Pt medication was reviewed and pt denies any changes.  Pt was set for TCS/MAC on 01/25/2015

## 2015-01-04 NOTE — Progress Notes (Signed)
REVIEWED-NO ADDITIONAL RECOMMENDATIONS. 

## 2015-01-04 NOTE — Telephone Encounter (Signed)
REVIEWED-NO ADDITIONAL RECOMMENDATIONS. 

## 2015-01-18 NOTE — Patient Instructions (Signed)
Nancy Espinoza  01/18/2015     @PREFPERIOPPHARMACY @   Your procedure is scheduled on  01/25/2015  Report to [name] at  830  A.M.  Call this number if you have problems the morning of surgery:  [phone]   Remember:  Do not eat food or drink liquids after midnight.  Take these medicines the morning of surgery with A SIP OF WATER  Klonopin, hydrocodone, paxil, zantac.   Do not wear jewelry, make-up or nail polish.  Do not wear lotions, powders, or perfumes.    Do not shave 48 hours prior to surgery.  Men may shave face and neck.  Do not bring valuables to the hospital.  [facility] is not responsible for any belongings or valuables.  Contacts, dentures or bridgework may not be worn into surgery.  Leave your suitcase in the car.  After surgery it may be brought to your room.  For patients admitted to the hospital, discharge time will be determined by your treatment team.  Patients discharged the day of surgery will not be allowed to drive home.   Name and phone number of your driver:   family Special instructions:  Follow the diet and prep instructions given to you by [name] office.  Please read over the following fact sheets that you were given. Pain Booklet, Coughing and Deep Breathing, Surgical Site Infection Prevention, Anesthesia Post-op Instructions and Care and Recovery After Surgery      Colonoscopy A colonoscopy is an exam to look at the entire large intestine (colon). This exam can help find problems such as tumors, polyps, inflammation, and areas of bleeding. The exam takes about 1 hour.  LET [facility] CARE PROVIDER KNOW ABOUT:   Any allergies you have.  All medicines you are taking, including vitamins, herbs, eye drops, creams, and over-the-counter medicines.  Previous problems you or members of your family have had with the use of anesthetics.  Any blood disorders you have.  Previous surgeries you have had.  Medical conditions you  have. RISKS AND COMPLICATIONS  Generally, this is a safe procedure. However, as with any procedure, complications can occur. Possible complications include:  Bleeding.  Tearing or rupture of the colon wall.  Reaction to medicines given during the exam.  Infection (rare). BEFORE THE PROCEDURE   Ask your health care provider about changing or stopping your regular medicines.  You may be prescribed an oral bowel prep. This involves drinking a large amount of medicated liquid, starting the day before your procedure. The liquid will cause you to have multiple loose stools until your stool is almost clear or light green. This cleans out your colon in preparation for the procedure.  Do not eat or drink anything else once you have started the bowel prep, unless your health care provider tells you it is safe to do so.  Arrange for someone to drive you home after the procedure. PROCEDURE   You will be given medicine to help you relax (sedative).  You will lie on your side with your knees bent.  A long, flexible tube with a light and camera on the end (colonoscope) will be inserted through the rectum and into the colon. The camera sends video back to a computer screen as it moves through the colon. The colonoscope also releases carbon dioxide gas to inflate the colon. This helps your health care provider see the area better.  During the exam, your health care provider may  take a small tissue sample (biopsy) to be examined under a microscope if any abnormalities are found.  The exam is finished when the entire colon has been viewed. AFTER THE PROCEDURE   Do not drive for 24 hours after the exam.  You may have a small amount of blood in your stool.  You may pass moderate amounts of gas and have mild abdominal cramping or bloating. This is caused by the gas used to inflate your colon during the exam.  Ask when your test results will be ready and how you will get your results. Make sure you  get your test results. Document Released: 05/11/2000 Document Revised: 03/04/2013 Document Reviewed: 01/19/2013 [facility] Patient Information 2015 [place], [place]. This information is not intended to replace advice given to you by your health care provider. Make sure you discuss any questions you have with your health care provider. PATIENT INSTRUCTIONS POST-ANESTHESIA  IMMEDIATELY FOLLOWING SURGERY:  Do not drive or operate machinery for the first twenty four hours after surgery.  Do not make any important decisions for twenty four hours after surgery or while taking narcotic pain medications or sedatives.  If you develop intractable nausea and vomiting or a severe headache please notify your doctor immediately.  FOLLOW-UP:  Please make an appointment with your surgeon as instructed. You do not need to follow up with anesthesia unless specifically instructed to do so.  WOUND CARE INSTRUCTIONS (if applicable):  Keep a dry clean dressing on the anesthesia/puncture wound site if there is drainage.  Once the wound has quit draining you may leave it open to air.  Generally you should leave the bandage intact for twenty four hours unless there is drainage.  If the epidural site drains for more than 36-48 hours please call the anesthesia department.  QUESTIONS?:  Please feel free to call your physician or the hospital operator if you have any questions, and they will be happy to assist you.

## 2015-01-19 ENCOUNTER — Encounter (HOSPITAL_COMMUNITY): Payer: Self-pay

## 2015-01-19 ENCOUNTER — Other Ambulatory Visit: Payer: Self-pay

## 2015-01-19 ENCOUNTER — Encounter (HOSPITAL_COMMUNITY)
Admission: RE | Admit: 2015-01-19 | Discharge: 2015-01-19 | Disposition: A | Discharge status: Home / Self Care | Payer: Medicaid Other | Source: Ambulatory Visit | Attending: Gastroenterology | Admitting: Gastroenterology

## 2015-01-19 DIAGNOSIS — Z01818 Encounter for other preprocedural examination: Secondary | ICD-10-CM | POA: Insufficient documentation

## 2015-01-19 HISTORY — DX: Polyneuropathy, unspecified: G62.9

## 2015-01-19 HISTORY — DX: Personal history of urinary calculi: Z87.442

## 2015-01-19 HISTORY — DX: Personal history of other diseases of the circulatory system: Z86.79

## 2015-01-19 HISTORY — DX: Gastro-esophageal reflux disease without esophagitis: K21.9

## 2015-01-19 HISTORY — DX: Unspecified osteoarthritis, unspecified site: M19.90

## 2015-01-19 LAB — BASIC METABOLIC PANEL
ANION GAP: 6 (ref 5–15)
BUN: 12 mg/dL (ref 6–20)
CHLORIDE: 104 mmol/L (ref 101–111)
CO2: 27 mmol/L (ref 22–32)
Calcium: 8.9 mg/dL (ref 8.9–10.3)
Creatinine, Ser: 1.03 mg/dL — ABNORMAL HIGH (ref 0.44–1.00)
GFR calc Af Amer: >60 mL/min (ref >60)
GFR, EST NON AFRICAN AMERICAN: 59 mL/min — AB (ref >60)
Glucose, Bld: 87 mg/dL (ref 65–99)
POTASSIUM: 4.5 mmol/L (ref 3.5–5.1)
SODIUM: 137 mmol/L (ref 135–145)

## 2015-01-19 LAB — CBC WITH DIFFERENTIAL/PLATELET
BASOS ABS: 0.1 10*3/uL (ref 0.0–0.1)
Basophils Relative: 1 % (ref 0–1)
Eosinophils Absolute: 0.6 10*3/uL (ref 0.0–0.7)
Eosinophils Relative: 7 % — ABNORMAL HIGH (ref 0–5)
HCT: 36 % (ref 36.0–46.0)
HEMOGLOBIN: 12.1 g/dL (ref 12.0–15.0)
LYMPHS PCT: 29 % (ref 12–46)
Lymphs Abs: 2.7 10*3/uL (ref 0.7–4.0)
MCH: 32.2 pg (ref 26.0–34.0)
MCHC: 33.6 g/dL (ref 30.0–36.0)
MCV: 95.7 fL (ref 78.0–100.0)
Monocytes Absolute: 0.9 10*3/uL (ref 0.1–1.0)
Monocytes Relative: 10 % (ref 3–12)
NEUTROS PCT: 53 % (ref 43–77)
Neutro Abs: 4.9 10*3/uL (ref 1.7–7.7)
Platelets: 330 10*3/uL (ref 150–400)
RBC: 3.76 MIL/uL — AB (ref 3.87–5.11)
RDW: 12.9 % (ref 11.5–15.5)
WBC: 9.3 10*3/uL (ref 4.0–10.5)

## 2015-01-19 NOTE — Pre-Procedure Instructions (Signed)
Patient given information to sign up for my chart at home. 

## 2015-01-24 ENCOUNTER — Ambulatory Visit (INDEPENDENT_AMBULATORY_CARE_PROVIDER_SITE_OTHER): Payer: No Typology Code available for payment source | Admitting: Orthopedic Surgery

## 2015-01-24 VITALS — BP 133/82 | Ht 67.0 in | Wt 189.0 lb

## 2015-01-24 DIAGNOSIS — M75101 Unspecified rotator cuff tear or rupture of right shoulder, not specified as traumatic: Secondary | ICD-10-CM

## 2015-01-24 NOTE — Patient Instructions (Signed)
Joint Injection  Care After  Refer to this sheet in the next few days. These instructions provide you with information on caring for yourself after you have had a joint injection. Your caregiver also may give you more specific instructions. Your treatment has been planned according to current medical practices, but problems sometimes occur. Call your caregiver if you have any problems or questions after your procedure.  After any type of joint injection, it is not uncommon to experience:  · Soreness, swelling, or bruising around the injection site.  · Mild numbness, tingling, or weakness around the injection site caused by the numbing medicine used before or with the injection.  It also is possible to experience the following effects associated with the specific agent after injection:  · Iodine-based contrast agents:  ¨ Allergic reaction (itching, hives, widespread redness, and swelling beyond the injection site).  · Corticosteroids (These effects are rare.):  ¨ Allergic reaction.  ¨ Increased blood sugar levels (If you have diabetes and you notice that your blood sugar levels have increased, notify your caregiver).  ¨ Increased blood pressure levels.  ¨ Mood swings.  · Hyaluronic acid in the use of viscosupplementation.  ¨ Temporary heat or redness.  ¨ Temporary rash and itching.  ¨ Increased fluid accumulation in the injected joint.  These effects all should resolve within a day after your procedure.   HOME CARE INSTRUCTIONS  · Limit yourself to light activity the day of your procedure. Avoid lifting heavy objects, bending, stooping, or twisting.  · Take prescription or over-the-counter pain medication as directed by your caregiver.  · You may apply ice to your injection site to reduce pain and swelling the day of your procedure. Ice may be applied 03-04 times:  ¨ Put ice in a plastic bag.  ¨ Place a towel between your skin and the bag.  ¨ Leave the ice on for no longer than 15-20 minutes each time.  SEEK  IMMEDIATE MEDICAL CARE IF:   · Pain and swelling get worse rather than better or extend beyond the injection site.  · Numbness does not go away.  · Blood or fluid continues to leak from the injection site.  · You have chest pain.  · You have swelling of your face or tongue.  · You have trouble breathing or you become dizzy.  · You develop a fever, chills, or severe tenderness at the injection site that last longer than 1 day.  MAKE SURE YOU:  · Understand these instructions.  · Watch your condition.  · Get help right away if you are not doing well or if you get worse.  Document Released: 01/25/2011 Document Revised: 08/06/2011 Document Reviewed: 01/25/2011  [employer]® Patient Information ©2015 [employer]. This information is not intended to replace advice given to you by your health care provider. Make sure you discuss any questions you have with your health care provider.

## 2015-01-24 NOTE — Progress Notes (Signed)
Chief Complaint  Patient presents with  . Follow-up    follow up right shoulder, possible injection    Follow-up for ongoing problems with the patient's right shoulder  She has [facility] 100%. She stopped going to therapy and they discharged for noncompliance  She presents back with continued pain in the right shoulder. Pain left knee  Pain lumbar spine which is chronic from the issue she had when she was working. Her x-ray from 2015 shows no spondylolisthesis mild loss of disc height at L4-5 and L5-S1 minor loss of disc height at L2 consistent with mild degenerative changes  We injected her right shoulder. I would not make her a surgical candidate because she does not have a rotator cuff tear which was documented on MRI. She is noncompliant with preoperative nonoperative treatment and would unlikely be compliant postoperatively  Recommend no further medication.  Inject right shoulder  We will follow-up for left knee injection   Procedure note the subacromial injection shoulder RIGHT  Verbal consent was obtained to inject the  RIGHT   Shoulder  Timeout was completed to confirm the injection site is a subacromial space of the  RIGHT  shoulder   Medication used Depo-Medrol 40 mg and lidocaine 1% 3 cc  Anesthesia was provided by ethyl chloride  The injection was performed in the RIGHT  posterior subacromial space. After pinning the skin with alcohol and anesthetized the skin with ethyl chloride the subacromial space was injected using a 20-gauge needle. There were no complications  Sterile dressing was applied.

## 2015-01-25 ENCOUNTER — Ambulatory Visit (HOSPITAL_COMMUNITY): Payer: Medicaid Other | Admitting: Anesthesiology

## 2015-01-25 ENCOUNTER — Ambulatory Visit (HOSPITAL_COMMUNITY)
Admission: RE | Admit: 2015-01-25 | Discharge: 2015-01-25 | Disposition: A | Discharge status: Home / Self Care | Payer: Medicaid Other | Source: Ambulatory Visit | Attending: Gastroenterology | Admitting: Gastroenterology

## 2015-01-25 ENCOUNTER — Encounter (HOSPITAL_COMMUNITY): Payer: Self-pay | Admitting: *Deleted

## 2015-01-25 ENCOUNTER — Encounter (HOSPITAL_COMMUNITY)
Admission: RE | Disposition: A | Discharge status: Home / Self Care | Payer: Self-pay | Source: Ambulatory Visit | Attending: Gastroenterology

## 2015-01-25 DIAGNOSIS — Z5309 Procedure and treatment not carried out because of other contraindication: Secondary | ICD-10-CM | POA: Diagnosis not present

## 2015-01-25 DIAGNOSIS — Z1211 Encounter for screening for malignant neoplasm of colon: Secondary | ICD-10-CM | POA: Insufficient documentation

## 2015-01-25 SURGERY — COLONOSCOPY WITH PROPOFOL
Anesthesia: Monitor Anesthesia Care

## 2015-01-25 MED ORDER — PROPOFOL 10 MG/ML IV BOLUS
INTRAVENOUS | Status: AC
  Filled 2015-01-25: qty 20

## 2015-01-25 MED ORDER — ALBUTEROL SULFATE HFA 108 (90 BASE) MCG/ACT IN AERS
INHALATION_SPRAY | RESPIRATORY_TRACT | Status: AC
  Filled 2015-01-25: qty 6.7

## 2015-01-25 MED ORDER — MIDAZOLAM HCL 2 MG/2ML IJ SOLN
INTRAMUSCULAR | Status: AC
  Filled 2015-01-25: qty 4

## 2015-01-25 NOTE — Progress Notes (Signed)
Dr Oneida Alar notified that patient ate solid food yesterday at 5 pm. And drank sips of coffee this am.   Colonoscopy to be rescheduled by office.   Ginger at Dr Oneida Alar office notified. Rosalyn Gess RN, BSN, CNOR, [employer]

## 2015-01-25 NOTE — Progress Notes (Signed)
PT ATE [employer] YESTERDAY. SHE NEEDS TO [facility] TCS.

## 2015-01-26 ENCOUNTER — Telehealth: Payer: Self-pay | Admitting: Gastroenterology

## 2015-01-26 NOTE — Telephone Encounter (Signed)
Patient called to reschedule her procedure. Call her back at [phone]

## 2015-01-27 DIAGNOSIS — K2970 Gastritis, unspecified, without bleeding: Secondary | ICD-10-CM

## 2015-01-27 DIAGNOSIS — D126 Benign neoplasm of colon, unspecified: Secondary | ICD-10-CM

## 2015-01-27 DIAGNOSIS — B9681 Helicobacter pylori [H. pylori] as the cause of diseases classified elsewhere: Secondary | ICD-10-CM

## 2015-01-27 HISTORY — DX: Helicobacter pylori (H. pylori) as the cause of diseases classified elsewhere: B96.81

## 2015-01-27 HISTORY — DX: Benign neoplasm of colon, unspecified: D12.6

## 2015-01-27 HISTORY — DX: Gastritis, unspecified, without bleeding: K29.70

## 2015-01-28 ENCOUNTER — Other Ambulatory Visit: Payer: Self-pay

## 2015-01-28 NOTE — Telephone Encounter (Signed)
REVIEWED-NO ADDITIONAL RECOMMENDATIONS. 

## 2015-01-28 NOTE — Telephone Encounter (Signed)
Pt called office this am to reschedule her TCS. Pt denies any changes to medications and pt is set for 02/22/2015 @ 10:15am.  Patient will be coming by next week to pick up prep and instructions

## 2015-02-08 ENCOUNTER — Encounter: Payer: Self-pay | Admitting: Orthopedic Surgery

## 2015-02-08 ENCOUNTER — Ambulatory Visit: Payer: No Typology Code available for payment source | Admitting: Orthopedic Surgery

## 2015-02-16 ENCOUNTER — Ambulatory Visit (INDEPENDENT_AMBULATORY_CARE_PROVIDER_SITE_OTHER): Payer: No Typology Code available for payment source | Admitting: Nurse Practitioner

## 2015-02-16 ENCOUNTER — Other Ambulatory Visit: Payer: Self-pay

## 2015-02-16 ENCOUNTER — Encounter (HOSPITAL_COMMUNITY)
Admission: RE | Admit: 2015-02-16 | Discharge: 2015-02-16 | Disposition: A | Discharge status: Home / Self Care | Payer: No Typology Code available for payment source | Source: Ambulatory Visit | Attending: Gastroenterology | Admitting: Gastroenterology

## 2015-02-16 ENCOUNTER — Other Ambulatory Visit (HOSPITAL_COMMUNITY): Payer: No Typology Code available for payment source

## 2015-02-16 ENCOUNTER — Encounter: Payer: Self-pay | Admitting: Nurse Practitioner

## 2015-02-16 VITALS — BP 112/78 | HR 92 | Temp 98.0°F | Ht 66.0 in | Wt 184.8 lb

## 2015-02-16 DIAGNOSIS — K219 Gastro-esophageal reflux disease without esophagitis: Secondary | ICD-10-CM | POA: Insufficient documentation

## 2015-02-16 DIAGNOSIS — R1310 Dysphagia, unspecified: Secondary | ICD-10-CM | POA: Insufficient documentation

## 2015-02-16 NOTE — Progress Notes (Signed)
No pcp per patient 

## 2015-02-16 NOTE — Progress Notes (Signed)
Referring Provider: No ref. provider found Primary Care Physician:  Pcp Not In System Primary GI:  Dr. Oneida Alar  Chief Complaint  Patient presents with  . Gastrophageal Reflux    HPI:   59 year old female presents for worsening GERD symptoms. Of note she is scheduled for colonoscopy next week. Per records has a history of GERD currently on Zantac. Today she states she has tried "all of them" when asked about PPI, named Prilosec, Nexium, [place], Pepto Bismol. Has epigastric pain. Deneis esophageal burning, belching. Has solid food dysphagia "right often." Passes eventually with drinking fluids. Occasionally has to regurgitate food back up. Denies hematemesis, hematochezia, melena. Denies fever, chills, unintentional weight loss. Denies chest pain, dyspnea, dizziness, lightheadedness, syncope, near syncope. Denies any other upper or lower GI symptoms.  Past Medical History  Diagnosis Date  . Back pain   . Knee pain, bilateral   . Anxiety   . GERD (gastroesophageal reflux disease)   . History of kidney stones   . Arthritis   . Neuropathy   . History of hypertension     non since she stopped drinking 2012; off all meds    Past Surgical History  Procedure Laterality Date  . Abdominal hysterectomy    . Tubal ligation      Current Outpatient Prescriptions  Medication Sig Dispense Refill  . Cholecalciferol (VITAMIN D3) 3000 UNITS TABS Take 2,000 Units by mouth daily.    . clonazePAM (KLONOPIN) 1 MG tablet Take 1 mg by mouth 3 (three) times daily.     Marland Kitchen ibuprofen (ADVIL,MOTRIN) 200 MG tablet Take 800 mg by mouth every 6 (six) hours as needed.    Marland Kitchen OVER THE COUNTER MEDICATION Take 1 tablet by mouth 2 (two) times daily. OTC antihistamine.    Marland Kitchen PARoxetine (PAXIL) 40 MG tablet Take 40 mg by mouth daily.    . ranitidine (ZANTAC) 150 MG tablet Take 150 mg by mouth daily as needed for heartburn.    Marland Kitchen HYDROcodone-acetaminophen (NORCO/VICODIN) 5-325 MG per tablet Take 1 tablet by mouth  every 8 (eight) hours as needed for moderate pain. (Patient not taking: Reported on 02/16/2015) 45 tablet 0   No current facility-administered medications for this visit.    Allergies as of 02/16/2015 - Review Complete 02/16/2015  Allergen Reaction Noted  . Codeine  07/23/2012  . Penicillins Swelling and Rash 06/08/2012    Family History  Problem Relation Age of Onset  . Colon cancer Neg Hx     Social History   Social History  . Marital Status: Married    Spouse Name: N/A  . Number of Children: N/A  . Years of Education: N/A   Social History Main Topics  . Smoking status: Current Every Day Smoker -- 1.00 packs/day for 40 years    Types: Cigarettes  . Smokeless tobacco: Never Used  . Alcohol Use: No     Comment: Stopped 4 years ago, previously drank 6-pack a night.  . Drug Use: No  . Sexual Activity: No   Other Topics Concern  . None   Social History Narrative    Review of Systems: 10-point ROS negative except as per HPI   Physical Exam: BP 112/78 mmHg  Pulse 92  Temp(Src) 98 F (36.7 C) (Oral)  Ht 5\' 6"  (1.676 m)  Wt 184 lb 12.8 oz (83.825 kg)  BMI 29.84 kg/m2 General:   Alert and oriented. Pleasant and cooperative. Well-nourished and well-developed.  Head:  Normocephalic and atraumatic. Eyes:  Without icterus, sclera  clear and conjunctiva pink.  Cardiovascular:  S1, S2 present without murmurs appreciated. Normal pulses noted. Extremities without clubbing or edema. Respiratory:  Clear to auscultation bilaterally. No wheezes, rales, or rhonchi. No distress.  Gastrointestinal:  +BS, soft, and non-distended. Mild to moderate epigastric TTP. No HSM noted. No guarding or rebound. No masses appreciated.  Rectal:  Deferred     02/16/2015 2:22 PM

## 2015-02-16 NOTE — Assessment & Plan Note (Signed)
Patient complains of worsening GERD symptoms. His currently on Zantac. Has tried multiple other medications including Gaviscon, Pepto, Prilosec, and Protonix. She may be a candidate for Nexium, Prevacid, her Dexilant depending on the results of her EGD. Symptoms are occurring regularly with breakthrough symptoms. We will add in EGD with possible dilation to artery scheduled colonoscopy in the OR with propofol/MAC, as noted below.

## 2015-02-16 NOTE — Assessment & Plan Note (Signed)
Complaining of dysphagia symptoms for solid food and pills. No problems with liquids. Food generally passes with time and liquids, however occasional regurgitation. No other red flag/warning signs or symptoms. We will add an EGD with possible dilation to her RA scheduled colonoscopy in the OR with propofol/MAC.  Proceed with EGD +/- dilation in OR with propofol (add to already scheduled TCS) with Dr. Oneida Alar in near future: the risks, benefits, and alternatives have been discussed with the patient in detail. The patient states understanding and desires to proceed.

## 2015-02-16 NOTE — Patient Instructions (Signed)
1. We will add an upper endoscopy with possible dilation to your are due to scheduled colonoscopy. 2. Further recommendations to be based on the results of your procedure.

## 2015-02-22 ENCOUNTER — Ambulatory Visit (HOSPITAL_COMMUNITY)
Admission: RE | Admit: 2015-02-22 | Discharge: 2015-02-22 | Disposition: A | Discharge status: Home / Self Care | Payer: Medicaid Other | Source: Ambulatory Visit | Attending: Gastroenterology | Admitting: Gastroenterology

## 2015-02-22 ENCOUNTER — Ambulatory Visit (HOSPITAL_COMMUNITY): Payer: Medicaid Other | Admitting: Anesthesiology

## 2015-02-22 ENCOUNTER — Encounter (HOSPITAL_COMMUNITY): Payer: Self-pay | Admitting: *Deleted

## 2015-02-22 ENCOUNTER — Encounter (HOSPITAL_COMMUNITY)
Admission: RE | Disposition: A | Discharge status: Home / Self Care | Payer: Self-pay | Source: Ambulatory Visit | Attending: Gastroenterology

## 2015-02-22 DIAGNOSIS — F419 Anxiety disorder, unspecified: Secondary | ICD-10-CM | POA: Diagnosis not present

## 2015-02-22 DIAGNOSIS — Q394 Esophageal web: Secondary | ICD-10-CM | POA: Diagnosis not present

## 2015-02-22 DIAGNOSIS — R1310 Dysphagia, unspecified: Secondary | ICD-10-CM | POA: Diagnosis not present

## 2015-02-22 DIAGNOSIS — K2980 Duodenitis without bleeding: Secondary | ICD-10-CM

## 2015-02-22 DIAGNOSIS — D123 Benign neoplasm of transverse colon: Secondary | ICD-10-CM | POA: Diagnosis not present

## 2015-02-22 DIAGNOSIS — Z1211 Encounter for screening for malignant neoplasm of colon: Secondary | ICD-10-CM

## 2015-02-22 DIAGNOSIS — K648 Other hemorrhoids: Secondary | ICD-10-CM | POA: Insufficient documentation

## 2015-02-22 DIAGNOSIS — K219 Gastro-esophageal reflux disease without esophagitis: Secondary | ICD-10-CM | POA: Diagnosis not present

## 2015-02-22 DIAGNOSIS — K2950 Unspecified chronic gastritis without bleeding: Secondary | ICD-10-CM | POA: Diagnosis not present

## 2015-02-22 DIAGNOSIS — B9681 Helicobacter pylori [H. pylori] as the cause of diseases classified elsewhere: Secondary | ICD-10-CM | POA: Diagnosis not present

## 2015-02-22 DIAGNOSIS — Z79899 Other long term (current) drug therapy: Secondary | ICD-10-CM | POA: Diagnosis not present

## 2015-02-22 DIAGNOSIS — R1013 Epigastric pain: Secondary | ICD-10-CM | POA: Diagnosis not present

## 2015-02-22 DIAGNOSIS — K2970 Gastritis, unspecified, without bleeding: Secondary | ICD-10-CM

## 2015-02-22 DIAGNOSIS — Q438 Other specified congenital malformations of intestine: Secondary | ICD-10-CM | POA: Insufficient documentation

## 2015-02-22 HISTORY — PX: BIOPSY: SHX5522

## 2015-02-22 HISTORY — PX: COLONOSCOPY WITH PROPOFOL: SHX5780

## 2015-02-22 HISTORY — DX: Benign neoplasm of colon, unspecified: D12.6

## 2015-02-22 HISTORY — PX: ESOPHAGOGASTRODUODENOSCOPY (EGD) WITH PROPOFOL: SHX5813

## 2015-02-22 HISTORY — PX: SAVORY DILATION: SHX5439

## 2015-02-22 HISTORY — DX: Helicobacter pylori (H. pylori) as the cause of diseases classified elsewhere: B96.81

## 2015-02-22 HISTORY — PX: POLYPECTOMY: SHX5525

## 2015-02-22 HISTORY — DX: Gastritis, unspecified, without bleeding: K29.70

## 2015-02-22 SURGERY — COLONOSCOPY WITH PROPOFOL
Anesthesia: Monitor Anesthesia Care

## 2015-02-22 MED ORDER — MIDAZOLAM HCL 2 MG/2ML IJ SOLN
INTRAMUSCULAR | Status: AC
Start: 2015-02-22 — End: 2015-02-22
  Filled 2015-02-22: qty 4

## 2015-02-22 MED ORDER — MIDAZOLAM HCL 2 MG/2ML IJ SOLN
INTRAMUSCULAR | Status: AC
  Filled 2015-02-22: qty 2

## 2015-02-22 MED ORDER — OMEPRAZOLE 20 MG PO CPDR: DELAYED_RELEASE_CAPSULE | ORAL | Status: AC

## 2015-02-22 MED ORDER — LIDOCAINE VISCOUS 2 % MT SOLN
OROMUCOSAL | Status: DC | PRN
  Administered 2015-02-22: 1

## 2015-02-22 MED ORDER — LIDOCAINE VISCOUS 2 % MT SOLN
3.0000 mL | Freq: Once | OROMUCOSAL | Status: AC
Start: 2015-02-22 — End: 2015-02-22
  Administered 2015-02-22: 3 mL via OROMUCOSAL

## 2015-02-22 MED ORDER — MINERAL OIL PO OIL
TOPICAL_OIL | ORAL | Status: AC
  Filled 2015-02-22: qty 30

## 2015-02-22 MED ORDER — FENTANYL CITRATE (PF) 100 MCG/2ML IJ SOLN
INTRAMUSCULAR | Status: AC
  Filled 2015-02-22: qty 4

## 2015-02-22 MED ORDER — ONDANSETRON HCL 4 MG/2ML IJ SOLN: 4.0000 mg | Freq: Once | INTRAMUSCULAR | Status: DC | PRN

## 2015-02-22 MED ORDER — LIDOCAINE VISCOUS 2 % MT SOLN
3.0000 mL | Freq: Once | OROMUCOSAL | Status: AC
  Administered 2015-02-22: 3 mL via OROMUCOSAL

## 2015-02-22 MED ORDER — LIDOCAINE VISCOUS 2 % MT SOLN
OROMUCOSAL | Status: AC
  Filled 2015-02-22: qty 15

## 2015-02-22 MED ORDER — MINERAL OIL LIGHT 100 % EX OIL
TOPICAL_OIL | CUTANEOUS | Status: DC | PRN
  Administered 2015-02-22: 1 via TOPICAL

## 2015-02-22 MED ORDER — LACTATED RINGERS IV SOLN
INTRAVENOUS | Status: DC | PRN
  Administered 2015-02-22: 10:00:00 via INTRAVENOUS

## 2015-02-22 MED ORDER — FENTANYL CITRATE (PF) 100 MCG/2ML IJ SOLN
25.0000 ug | INTRAMUSCULAR | Status: AC
  Administered 2015-02-22 (×2): 25 ug via INTRAVENOUS

## 2015-02-22 MED ORDER — FENTANYL CITRATE (PF) 100 MCG/2ML IJ SOLN
INTRAMUSCULAR | Status: AC
  Filled 2015-02-22: qty 2

## 2015-02-22 MED ORDER — STERILE WATER FOR IRRIGATION IR SOLN
Status: DC | PRN
  Administered 2015-02-22: 1000 mL

## 2015-02-22 MED ORDER — MIDAZOLAM HCL 5 MG/5ML IJ SOLN
INTRAMUSCULAR | Status: DC | PRN
  Administered 2015-02-22: 2 mg via INTRAVENOUS

## 2015-02-22 MED ORDER — FENTANYL CITRATE (PF) 100 MCG/2ML IJ SOLN: 25.0000 ug | INTRAMUSCULAR | Status: DC | PRN

## 2015-02-22 MED ORDER — LIDOCAINE HCL (CARDIAC) 10 MG/ML IV SOLN
INTRAVENOUS | Status: DC | PRN
  Administered 2015-02-22: 50 mg via INTRAVENOUS

## 2015-02-22 MED ORDER — GLYCOPYRROLATE 0.2 MG/ML IJ SOLN
INTRAMUSCULAR | Status: AC
Start: 2015-02-22 — End: 2015-02-22
  Filled 2015-02-22: qty 1

## 2015-02-22 MED ORDER — PROPOFOL 10 MG/ML IV BOLUS
INTRAVENOUS | Status: AC
  Filled 2015-02-22: qty 40

## 2015-02-22 MED ORDER — MIDAZOLAM HCL 2 MG/2ML IJ SOLN
1.0000 mg | INTRAMUSCULAR | Status: DC | PRN
  Administered 2015-02-22 (×2): 2 mg via INTRAVENOUS

## 2015-02-22 MED ORDER — FENTANYL CITRATE (PF) 100 MCG/2ML IJ SOLN
INTRAMUSCULAR | Status: DC | PRN
  Administered 2015-02-22 (×4): 25 ug via INTRAVENOUS

## 2015-02-22 MED ORDER — ONDANSETRON HCL 4 MG/2ML IJ SOLN
INTRAMUSCULAR | Status: AC
  Filled 2015-02-22: qty 2

## 2015-02-22 MED ORDER — LACTATED RINGERS IV SOLN
INTRAVENOUS | Status: DC
  Administered 2015-02-22: 11:00:00 via INTRAVENOUS

## 2015-02-22 MED ORDER — LIDOCAINE HCL (PF) 1 % IJ SOLN
INTRAMUSCULAR | Status: AC
  Filled 2015-02-22: qty 20

## 2015-02-22 MED ORDER — ONDANSETRON HCL 4 MG/2ML IJ SOLN
4.0000 mg | Freq: Once | INTRAMUSCULAR | Status: AC
  Administered 2015-02-22: 4 mg via INTRAVENOUS

## 2015-02-22 MED ORDER — PROPOFOL 500 MG/50ML IV EMUL
INTRAVENOUS | Status: DC | PRN
  Administered 2015-02-22: 100 ug/kg/min via INTRAVENOUS
  Administered 2015-02-22: 11:00:00 via INTRAVENOUS

## 2015-02-22 MED ORDER — GLYCOPYRROLATE 0.2 MG/ML IJ SOLN
0.2000 mg | Freq: Once | INTRAMUSCULAR | Status: AC
  Administered 2015-02-22: 0.2 mg via INTRAVENOUS

## 2015-02-22 SURGICAL SUPPLY — 28 items
AMPLIFEYE LARGE BLUE (MISCELLANEOUS) ×4 IMPLANT
BLOCK BITE 60FR ADLT L/F BLUE (MISCELLANEOUS) ×4 IMPLANT
ELECT REM PT RETURN 9FT ADLT (ELECTROSURGICAL) ×4
ELECTRODE REM PT RTRN 9FT ADLT (ELECTROSURGICAL) IMPLANT
FCP BXJMBJMB 240X2.8X (CUTTING FORCEPS)
FLOOR PAD 36X40 (MISCELLANEOUS) ×4
FORCEP RJ3 GP 1.8X160 W-NEEDLE (CUTTING FORCEPS) IMPLANT
FORCEPS BIOP RAD 4 LRG CAP 4 (CUTTING FORCEPS) ×2 IMPLANT
FORCEPS BIOP RJ4 240 W/NDL (CUTTING FORCEPS)
FORCEPS BXJMBJMB 240X2.8X (CUTTING FORCEPS) IMPLANT
FORMALIN 10 PREFIL 20ML (MISCELLANEOUS) ×4 IMPLANT
INJECTOR/SNARE I SNARE (MISCELLANEOUS) IMPLANT
KIT ENDO PROCEDURE PEN (KITS) ×6 IMPLANT
MANIFOLD NEPTUNE II (INSTRUMENTS) ×4 IMPLANT
NDL SCLEROTHERAPY 25GX240 (NEEDLE) IMPLANT
NEEDLE SCLEROTHERAPY 25GX240 (NEEDLE) IMPLANT
PAD FLOOR 36X40 (MISCELLANEOUS) ×2 IMPLANT
PROBE APC STR FIRE (PROBE) IMPLANT
PROBE INJECTION GOLD (MISCELLANEOUS)
PROBE INJECTION GOLD 7FR (MISCELLANEOUS) IMPLANT
SNARE ROTATE MED OVAL 20MM (MISCELLANEOUS) IMPLANT
SNARE SHORT THROW 13M SML OVAL (MISCELLANEOUS) ×6 IMPLANT
SYR 50ML LL SCALE MARK (SYRINGE) ×2 IMPLANT
SYR INFLATION 60ML (SYRINGE) IMPLANT
TRAP SPECIMEN MUCOUS 40CC (MISCELLANEOUS) IMPLANT
TUBING ENDO SMARTCAP PENTAX (MISCELLANEOUS) ×2 IMPLANT
TUBING IRRIGATION ENDOGATOR (MISCELLANEOUS) ×4 IMPLANT
WATER STERILE IRR 1000ML POUR (IV SOLUTION) ×2 IMPLANT

## 2015-02-22 NOTE — H&P (Signed)
  Primary Care Physician:  Pcp Not In System Primary Gastroenterologist:  Dr. Oneida Alar  Pre-Procedure History & Physical: HPI:  Nancy Espinoza is a 59 y.o. female here for DYSPHAGIA/screening.  Past Medical History  Diagnosis Date  . Back pain   . Knee pain, bilateral   . Anxiety   . GERD (gastroesophageal reflux disease)   . History of kidney stones   . Arthritis   . Neuropathy   . History of hypertension     non since she stopped drinking 2012; off all meds    Past Surgical History  Procedure Laterality Date  . Abdominal hysterectomy    . Tubal ligation      Prior to Admission medications   Medication Sig Start Date End Date Taking? Authorizing Provider  Cholecalciferol (VITAMIN D3) 3000 UNITS TABS Take 2,000 Units by mouth daily.   Yes Historical Provider, MD  clonazePAM (KLONOPIN) 1 MG tablet Take 1 mg by mouth 3 (three) times daily.    Yes Historical Provider, MD  ibuprofen (ADVIL,MOTRIN) 200 MG tablet Take 800 mg by mouth every 6 (six) hours as needed.   Yes Historical Provider, MD  OVER THE COUNTER MEDICATION Take 1 tablet by mouth 2 (two) times daily. OTC antihistamine.   Yes Historical Provider, MD  PARoxetine (PAXIL) 40 MG tablet Take 40 mg by mouth daily.   Yes Historical Provider, MD  ranitidine (ZANTAC) 150 MG tablet Take 150 mg by mouth daily as needed for heartburn.   Yes Historical Provider, MD  HYDROcodone-acetaminophen (NORCO/VICODIN) 5-325 MG per tablet Take 1 tablet by mouth every 8 (eight) hours as needed for moderate pain. Patient not taking: Reported on 02/16/2015 12/01/14   Carole Civil, MD    Allergies as of 01/28/2015 - Review Complete 01/25/2015  Allergen Reaction Noted  . Codeine  07/23/2012  . Penicillins Swelling and Rash 06/08/2012    Family History  Problem Relation Age of Onset  . Colon cancer Neg Hx     Social History   Social History  . Marital Status: Married    Spouse Name: N/A  . Number of Children: N/A  . Years of  Education: N/A   Occupational History  . Not on file.   Social History Main Topics  . Smoking status: Current Every Day Smoker -- 1.00 packs/day for 40 years    Types: Cigarettes  . Smokeless tobacco: Never Used  . Alcohol Use: No     Comment: Stopped 4 years ago, previously drank 6-pack a night.  . Drug Use: No  . Sexual Activity: No   Other Topics Concern  . Not on file   Social History Narrative    Review of Systems: See HPI, otherwise negative ROS   Physical Exam: BP 144/84 mmHg  Pulse 86  Temp(Src) 97.4 F (36.3 C) (Oral)  Resp 15  Ht 5\' 6"  (1.676 m)  Wt 184 lb (83.462 kg)  BMI 29.71 kg/m2  SpO2 98% General:   Alert,  pleasant and cooperative in NAD Head:  Normocephalic and atraumatic. Neck:  Supple; Lungs:  Clear throughout to auscultation.    Heart:  Regular rate and rhythm. Abdomen:  Soft, nontender and nondistended. Normal bowel sounds, without guarding, and without rebound.   Neurologic:  Alert and  oriented x4;  grossly normal neurologically.  Impression/Plan:     DYSPHAGIA/screening  PLAN:  EGD/DIL/tcs TODAY

## 2015-02-22 NOTE — Op Note (Addendum)
[facility] [address] [place], [address]   ENDOSCOPY PROCEDURE REPORT  PATIENT: Nancy Espinoza, Nancy Espinoza  MR#: #[id number] BIRTHDATE: 11-Apr-1956 , 89  yrs. old GENDER: female  ENDOSCOPIST: Danie Binder, MD REFFERED BY:  PROCEDURE DATE:  2015/03/11 PROCEDURE:   EGD with biopsy and EGD with dilatation over guidewire   INDICATIONS:1.  dyspepsia.   2.  dysphagia. MEDICATIONS: Monitored anesthesia care TOPICAL ANESTHETIC: Viscous Xylocaine  DESCRIPTION OF PROCEDURE:   After the risks benefits and alternatives of the procedure were thoroughly explained, informed consent was obtained.  The     endoscope was introduced through the mouth and advanced to the second portion of the duodenum. The instrument was slowly withdrawn as the mucosa was carefully examined.  Prior to withdrawal of the scope, the guidwire was placed.  The esophagus was dilated successfully.  The patient was recovered in endoscopy and discharged home in satisfactory condition. Estimated blood loss is zero unless otherwise noted in this procedure report.   ESOPHAGUS: The mucosa of the esophagus appeared normal.   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 STOMACH: Moderate erosive gastritis (inflammation) was found in the gastric antrum.  Multiple biopsies were performed using cold forceps.   DUODENUM: Mild duodenal inflammation was found in the duodenal bulb.   The duodenal mucosa showed no abnormalities in the 2nd part of the duodenum.   Dilation was then performed at the proximal esophagus Dilator: Savary over guidewire Size(s): 15-16 mm Resistance: minimal Heme: none  COMPLICATIONS: There were no immediate complications.  ENDOSCOPIC IMPRESSION: 1.  DYSAGIA DUE TO GERD/POOR DENTITION AND/OR PROXIMAL ESOPHAGEAL WEB 2.   MODERATE Erosive gastritisAND MILD DUODENITIS  RECOMMENDATIONS: DRINK WATER TO KEEP URINE LIGHT YELLOW. CONTINUE WEIGHT LOSS EFFORTS.  LOSE 10  LBS. FOLLOW A LOW FAT DIET. HOLD IBUPROFEN FOR 7 DAYS. START OMEPRAZOLE.  TAKE 30 MINUTES PRIOR TO MEALS BID. ZANTAC HELPS MOST WHEN USED AS NEEDED. AWAIT BIOPSY RESULTS . Follow up in 4 mos. Next colonoscopy in 5-10 years. _______________________________ eSignedDanie Binder, MD 2015-03-11 4:48 PM   CPT CODES: ICD CODES:  The ICD and CPT codes recommended by this software are interpretations from the data that the clinical staff has captured with the software.  The verification of the translation of this report to the ICD and CPT codes and modifiers is the sole responsibility of the health care institution and practicing physician where this report was generated.  [employer] will not be held responsible for the validity of the ICD and CPT codes included on this report.  AMA assumes no liability for data contained or not contained herein. [employer] is a Designer, television/film set of the [employer].

## 2015-02-22 NOTE — Transfer of Care (Signed)
Immediate Anesthesia Transfer of Care Note  Patient: STEVEY STAPLETON  Procedure(s) Performed: Procedure(s) with comments: COLONOSCOPY WITH PROPOFOL (N/A) - procedure 1 cecum time in 1123  time out  1132  total time 9 minutes ESOPHAGOGASTRODUODENOSCOPY (EGD) WITH PROPOFOL (N/A)  ESOPHAGEAL DILATION (N/A) - Savory 15/16 POLYPECTOMY (N/A) - transverse colon BIOPSY - gastric  Patient Location: PACU  Anesthesia Type:MAC  Level of Consciousness: awake, alert , oriented and patient cooperative  Airway & Oxygen Therapy: Patient Spontanous Breathing and Patient connected to nasal cannula oxygen  Post-op Assessment: Report given to RN and Post -op Vital signs reviewed and stable  Post vital signs: Reviewed and stable  Last Vitals:  Filed Vitals:   02/22/15 1050  BP: 136/87  Pulse:   Temp:   Resp: 12    Complications: No apparent anesthesia complications

## 2015-02-22 NOTE — Anesthesia Preprocedure Evaluation (Signed)
Anesthesia Evaluation  Patient identified by MRN, date of birth, ID band Patient awake    Airway Mallampati: II  TM Distance: >3 FB     Dental  (+) Edentulous Upper, Partial Lower, Poor Dentition   Pulmonary Current Smoker,    breath sounds clear to auscultation       Cardiovascular hypertension,  Rhythm:Regular Rate:Normal     Neuro/Psych PSYCHIATRIC DISORDERS Anxiety    GI/Hepatic GERD  Medicated,(+)     substance abuse (stopped 2012)  alcohol use,   Endo/Other    Renal/GU      Musculoskeletal   Abdominal   Peds  Hematology   Anesthesia Other Findings   Reproductive/Obstetrics                             Anesthesia Physical Anesthesia Plan  ASA: III  Anesthesia Plan: MAC   Post-op Pain Management:    Induction: Intravenous  Airway Management Planned: Simple Face Mask  Additional Equipment:   Intra-op Plan:   Post-operative Plan:   Informed Consent: I have reviewed the patients History and Physical, chart, labs and discussed the procedure including the risks, benefits and alternatives for the proposed anesthesia with the patient or authorized representative who has indicated his/her understanding and acceptance.     Plan Discussed with:   Anesthesia Plan Comments:         Anesthesia Quick Evaluation

## 2015-02-22 NOTE — Anesthesia Postprocedure Evaluation (Signed)
  Anesthesia Post-op Note  Patient: Nancy Espinoza  Procedure(s) Performed: Procedure(s) with comments: COLONOSCOPY WITH PROPOFOL (N/A) - procedure 1 cecum time in 1123  time out  1132  total time 9 minutes ESOPHAGOGASTRODUODENOSCOPY (EGD) WITH PROPOFOL (N/A)  ESOPHAGEAL DILATION (N/A) - Savory 15/16 POLYPECTOMY (N/A) - transverse colon BIOPSY - gastric  Patient Location: PACU  Anesthesia Type:MAC  Level of Consciousness: awake, alert , oriented and patient cooperative  Airway and Oxygen Therapy: Patient Spontanous Breathing and Patient connected to nasal cannula oxygen  Post-op Pain: none  Post-op Assessment: Post-op Vital signs reviewed, Patient's Cardiovascular Status Stable, Respiratory Function Stable, Patent Airway and No signs of Nausea or vomiting              Post-op Vital Signs: Reviewed and stable  Last Vitals:  Filed Vitals:   02/22/15 1050  BP: 136/87  Pulse:   Temp:   Resp: 12    Complications: No apparent anesthesia complications

## 2015-02-22 NOTE — Discharge Instructions (Signed)
YOU HAVE GASTRITIS DUE TO IBUPROFEN. I STRETCHED YOUR ESOPHAGUS DUE YOUR PROBLEMS SWALLOWING BUT IT IS MOST LIKELY DUE TO UNCONTROLLED REFLUX AND BEING UNABLE TO CHEW FOOD PROPERLY DUE TO YOUR DENTAL ISSUES. You had 1 COLON polyp removed. YOU HAVE SMALL NTERNAL HEMORRHOIDS.   DRINK WATER TO KEEP YOUR URINE LIGHT YELLOW.  CONTINUE YOUR WEIGHT LOSS EFFORTS. LOSE 10 LBS.  FOLLOW A LOW FAT DIET. MEATS SHOULD BE BAKED, BROILED, OR BOILED.  MEATS SHOULD BE CHOPPED OR GROUND ONLY. DO NOT EAT CHUNKS OF ANYTHING. SEE INFO BELOW.  HOLD IBUPROFEN FOR 7 DAYS.   START OMEPRAZOLE. TAKE 30 MINUTES PRIOR TO YOUR MEALS TWICE DAILY.  ZANTAC HELPS MOST WHEN USED AS NEEDED.  YOUR BIOPSY RESULTS WILL BE AVAILABLE IN MY CHART AFTER SEP 28 AND MY OFFICE WILL CONTACT YOU IN 10-14 DAYS WITH YOUR RESULTS.   Follow up in 4 mos.  Next colonoscopy in 5-10 years.   ENDOSCOPY Care After Read the instructions outlined below and refer to this sheet in the next week. These discharge instructions provide you with general information on caring for yourself after you leave the hospital. While your treatment has been planned according to the most current medical practices available, unavoidable complications occasionally occur. If you have any problems or questions after discharge, call DR. Devlyn Retter, [phone].  ACTIVITY  You may resume your regular activity, but move at a slower pace for the next 24 hours.   Take frequent rest periods for the next 24 hours.   Walking will help get rid of the air and reduce the bloated feeling in your belly (abdomen).   No driving for 24 hours (because of the medicine (anesthesia) used during the test).   You may shower.   Do not sign any important legal documents or operate any machinery for 24 hours (because of the anesthesia used during the test).    NUTRITION  Drink plenty of fluids.   You may resume your normal diet as instructed by your doctor.   Begin with a light  meal and progress to your normal diet. Heavy or fried foods are harder to digest and may make you feel sick to your stomach (nauseated).   Avoid alcoholic beverages for 24 hours or as instructed.    MEDICATIONS  You may resume your normal medications.   WHAT YOU CAN EXPECT TODAY  Some feelings of bloating in the abdomen.   Passage of more gas than usual.   Spotting of blood in your stool or on the toilet paper  .  IF YOU HAD POLYPS REMOVED DURING THE ENDOSCOPY:  Eat a soft diet IF YOU HAVE NAUSEA, BLOATING, ABDOMINAL PAIN, OR VOMITING.    FINDING OUT THE RESULTS OF YOUR TEST Not all test results are available during your visit. DR. Oneida Alar WILL CALL YOU WITHIN 14 DAYS OF YOUR PROCEDUE WITH YOUR RESULTS. Do not assume everything is normal if you have not heard from DR. Judeen Geralds, CALL HER OFFICE AT [phone].  SEEK IMMEDIATE MEDICAL ATTENTION AND CALL THE OFFICE: [phone] IF:  You have more than a spotting of blood in your stool.   Your belly is swollen (abdominal distention).   You are nauseated or vomiting.   You have a temperature over 101F.   You have abdominal pain or discomfort that is severe or gets worse throughout the day.  Lifestyle and home remedies TO MANAGE REFLUX/CHEST PAIN  You may eliminate or reduce the frequency of heartburn by making the following lifestyle changes:  Control your weight. Being overweight is a major risk factor for heartburn and GERD. Excess pounds put pressure on your abdomen, pushing up your stomach and causing acid to back up into your esophagus.    Eat smaller meals. 4 TO 6 MEALS A DAY. This reduces pressure on the lower esophageal sphincter, helping to prevent the valve from opening and acid from washing back into your esophagus.    Loosen your belt. Clothes that fit tightly around your waist put pressure on your abdomen and the lower esophageal sphincter.    Eliminate heartburn triggers. Everyone has specific triggers.  Common triggers such as fatty or fried foods, spicy food, tomato sauce, carbonated beverages, alcohol, chocolate, mint, garlic, onion, caffeine and nicotine may make heartburn worse.    Avoid stooping or bending. Tying your shoes is OK. Bending over for longer periods to weed your garden isn't, especially soon after eating.    Don't lie down after a meal. Wait at least three to four hours after eating before going to bed, and don't lie down right after eating.    PUT THE HEAD OF YOUR BED ON 6 INCH BLOCKS.   Alternative medicine  Several home remedies exist for treating GERD, but they provide only temporary relief. They include drinking baking soda (sodium bicarbonate) added to water or drinking other fluids such as baking soda mixed with cream of tartar and water.   Although these liquids create temporary relief by neutralizing, washing away or buffering acids, eventually they aggravate the situation by adding gas and fluid to your stomach, increasing pressure and causing more acid reflux. Further, adding more sodium to your diet may increase your blood pressure and add stress to your heart, and excessive bicarbonate ingestion can alter the acid-base balance in your body.      Low-Fat Diet BREADS, CEREALS, PASTA, RICE, DRIED PEAS, AND BEANS These products are high in carbohydrates and most are low in fat. Therefore, they can be increased in the diet as substitutes for fatty foods. They too, however, contain calories and should not be eaten in excess. Cereals can be eaten for snacks as well as for breakfast.   FRUITS AND VEGETABLES It is good to eat fruits and vegetables. Besides being sources of fiber, both are rich in vitamins and some minerals. They help you get the daily allowances of these nutrients. Fruits and vegetables can be used for snacks and desserts.  MEATS Limit lean meat, chicken, [place], and fish to no more than 6 ounces per day. Beef, Pork, and Lamb Use lean cuts of  beef, pork, and lamb. Lean cuts include:  Extra-lean ground beef.  Arm roast.  Sirloin tip.  Center-cut ham.  Round steak.  Loin chops.  Rump roast.  Tenderloin.  Trim all fat off the outside of meats before cooking. It is not necessary to severely decrease the intake of red meat, but lean choices should be made. Lean meat is rich in protein and contains a highly absorbable form of iron. Premenopausal women, in particular, should avoid reducing lean red meat because this could increase the risk for low red blood cells (iron-deficiency anemia).  Chicken and [place] These are good sources of protein. The fat of poultry can be reduced by removing the skin and underlying fat layers before cooking. Chicken and [place] can be substituted for lean red meat in the diet. Poultry should not be fried or covered with high-fat sauces. Fish and Shellfish Fish is a good source of protein. Shellfish contain cholesterol,  but they usually are low in saturated fatty acids. The preparation of fish is important. Like chicken and [place], they should not be fried or covered with high-fat sauces. EGGS Egg whites contain no fat or cholesterol. They can be eaten often. Try 1 to 2 egg whites instead of whole eggs in recipes or use egg substitutes that do not contain yolk. MILK AND DAIRY PRODUCTS Use skim or 1% milk instead of 2% or whole milk. Decrease whole milk, natural, and processed cheeses. Use nonfat or low-fat (2%) cottage cheese or low-fat cheeses made from vegetable oils. Choose nonfat or low-fat (1 to 2%) yogurt. Experiment with evaporated skim milk in recipes that call for heavy cream. Substitute low-fat yogurt or low-fat cottage cheese for sour cream in dips and salad dressings. Have at least 2 servings of low-fat dairy products, such as 2 glasses of skim (or 1%) milk each day to help get your daily calcium intake. FATS AND OILS Reduce the total intake of fats, especially saturated fat. Butterfat, lard, and beef  fats are high in saturated fat and cholesterol. These should be avoided as much as possible. Vegetable fats do not contain cholesterol, but certain vegetable fats, such as coconut oil, palm oil, and palm kernel oil are very high in saturated fats. These should be limited. These fats are often used in bakery goods, processed foods, popcorn, oils, and nondairy creamers. Vegetable shortenings and some peanut butters contain hydrogenated oils, which are also saturated fats. Read the labels on these foods and check for saturated vegetable oils. Unsaturated vegetable oils and fats do not raise blood cholesterol. However, they should be limited because they are fats and are high in calories. Total fat should still be limited to 30% of your daily caloric intake. Desirable liquid vegetable oils are corn oil, cottonseed oil, olive oil, canola oil, safflower oil, soybean oil, and sunflower oil. Peanut oil is not as good, but small amounts are acceptable. Buy a heart-healthy tub margarine that has no partially hydrogenated oils in the ingredients. Mayonnaise and salad dressings often are made from unsaturated fats, but they should also be limited because of their high calorie and fat content. Seeds, nuts, peanut butter, olives, and avocados are high in fat, but the fat is mainly the unsaturated type. These foods should be limited mainly to avoid excess calories and fat. OTHER EATING TIPS Snacks  Most sweets should be limited as snacks. They tend to be rich in calories and fats, and their caloric content outweighs their nutritional value. Some good choices in snacks are graham crackers, melba toast, soda crackers, bagels (no egg), English muffins, fruits, and vegetables. These snacks are preferable to snack crackers, [place] fries, TORTILLA CHIPS, and POTATO chips. Popcorn should be air-popped or cooked in small amounts of liquid vegetable oil. Desserts Eat fruit, low-fat yogurt, and fruit ices instead of pastries, cake,  and cookies. Sherbet, angel food cake, gelatin dessert, frozen low-fat yogurt, or other frozen products that do not contain saturated fat (pure fruit juice bars, frozen ice pops) are also acceptable.  COOKING METHODS Choose those methods that use little or no fat. They include: Poaching.  Braising.  Steaming.  Grilling.  Baking.  Stir-frying.  Broiling.  Microwaving.  Foods can be cooked in a nonstick pan without added fat, or use a nonfat cooking spray in regular cookware. Limit fried foods and avoid frying in saturated fat. Add moisture to lean meats by using water, broth, cooking wines, and other nonfat or low-fat sauces along with  the cooking methods mentioned above. Soups and stews should be chilled after cooking. The fat that forms on top after a few hours in the refrigerator should be skimmed off. When preparing meals, avoid using excess salt. Salt can contribute to raising blood pressure in some people.  EATING AWAY FROM HOME Order entres, potatoes, and vegetables without sauces or butter. When meat exceeds the size of a deck of cards (3 to 4 ounces), the rest can be taken home for another meal. Choose vegetable or fruit salads and ask for low-calorie salad dressings to be served on the side. Use dressings sparingly. Limit high-fat toppings, such as bacon, crumbled eggs, cheese, sunflower seeds, and olives. Ask for heart-healthy tub margarine instead of butter.   Polyps, Colon  A polyp is extra tissue that grows inside your body. Colon polyps grow in the large intestine. The large intestine, also called the colon, is part of your digestive system. It is a long, hollow tube at the end of your digestive tract where your body makes and stores stool. Most polyps are not dangerous. They are benign. This means they are not cancerous. But over time, some types of polyps can turn into cancer. Polyps that are smaller than a pea are usually not harmful. But larger polyps could someday become or  may already be cancerous. To be safe, doctors remove all polyps and test them.   PREVENTION There is not one sure way to prevent polyps. You might be able to lower your risk of getting them if you:  Eat more fruits and vegetables and less fatty food.   Do not smoke.   Avoid alcohol.   Exercise every day.   Lose weight if you are overweight.   Eating more calcium and folate can also lower your risk of getting polyps. Some foods that are rich in calcium are milk, cheese, and broccoli. Some foods that are rich in folate are chickpeas, kidney beans, and spinach.    Hemorrhoids Hemorrhoids are dilated (enlarged) veins around the rectum. Sometimes clots will form in the veins. This makes them swollen and painful. These are called thrombosed hemorrhoids. Causes of hemorrhoids include:  Constipation.   Straining to have a bowel movement.   HEAVY LIFTING HOME CARE INSTRUCTIONS  Eat a well balanced diet and drink 6 to 8 glasses of water every day to avoid constipation. You may also use a bulk laxative.   Avoid straining to have bowel movements.   Keep anal area dry and clean.   Do not use a donut shaped pillow or sit on the toilet for long periods. This increases blood pooling and pain.   Move your bowels when your body has the urge; this will require less straining and will decrease pain and pressure.

## 2015-02-22 NOTE — Op Note (Signed)
[facility] [address] [place], [address]   COLONOSCOPY PROCEDURE REPORT  PATIENT: Nancy Espinoza, Nancy Espinoza  MR#: #[id number] BIRTHDATE: Nov 28, 1955 , 85  yrs. old GENDER: female ENDOSCOPIST: Danie Binder, MD REFERRED BY: PROCEDURE DATE:  02-26-15 PROCEDURE:   Colonoscopy with snare polypectomy INDICATIONS:average risk patient for colon cancer. MEDICATIONS: Monitored anesthesia care  DESCRIPTION OF PROCEDURE:    Physical exam was performed.  Informed consent was obtained from the patient after explaining the benefits, risks, and alternatives to procedure.  The patient was connected to monitor and placed in left lateral position. Continuous oxygen was provided by nasal cannula and IV medicine administered through an indwelling cannula.  After administration of sedation and rectal exam, the patients rectum was intubated and the     colonoscope was advanced under direct visualization to the cecum.  The scope was removed slowly by carefully examining the color, texture, anatomy, and integrity mucosa on the way out.  The patient was recovered in endoscopy and discharged home in satisfactory condition. Estimated blood loss is zero unless otherwise noted in this procedure report.    COLON FINDINGS: A sessile polyp measuring 6 mm in size was found in the distal transverse colon.  A polypectomy was performed using snare cautery.  , Moderate sized internal hemorrhoids were found. , and The colon was redundant.  Manual abdominal counter-pressure was used to reach the cecum.  PREP QUALITY: good.  CECAL W/D TIME: 9       minutes COMPLICATIONS: None  ENDOSCOPIC IMPRESSION: 1.   ONE COLON POLYP REMOVED 2.   Moderate sized internal hemorrhoids 3.   The LEFT colon IS redundant  RECOMMENDATIONS: DRINK WATER TO KEEP URINE LIGHT YELLOW. CONTINUE WEIGHT LOSS EFFORTS.  LOSE 10 LBS. FOLLOW A LOW FAT DIET. HOLD IBUPROFEN FOR 7 DAYS. START OMEPRAZOLE.  TAKE 30 MINUTES PRIOR TO  MEALS BID. ZANTAC HELPS MOST WHEN USED AS NEEDED. AWAIT BIOPSY RESULTS . Follow up in 4 mos. Next colonoscopy in 5-10 years.   _______________________________ eSignedDanie Binder, MD 02/26/2015 4:43 PM   CPT CODES: ICD CODES:  The ICD and CPT codes recommended by this software are interpretations from the data that the clinical staff has captured with the software.  The verification of the translation of this report to the ICD and CPT codes and modifiers is the sole responsibility of the health care institution and practicing physician where this report was generated.  [employer] will not be held responsible for the validity of the ICD and CPT codes included on this report.  AMA assumes no liability for data contained or not contained herein. CPT is a Designer, television/film set of the [employer].

## 2015-02-22 NOTE — Anesthesia Procedure Notes (Signed)
Procedure Name: MAC Date/Time: 02/22/2015 10:59 AM Performed by: Andree Elk, Alga Southall A Pre-anesthesia Checklist: Patient identified, Timeout performed, Emergency Drugs available, Suction available and Patient being monitored Oxygen Delivery Method: Simple face mask

## 2015-02-22 NOTE — Progress Notes (Signed)
REVIEWED-NO ADDITIONAL RECOMMENDATIONS. 

## 2015-02-23 ENCOUNTER — Telehealth: Payer: Self-pay | Admitting: Gastroenterology

## 2015-02-23 ENCOUNTER — Encounter (HOSPITAL_COMMUNITY): Payer: Self-pay | Admitting: Gastroenterology

## 2015-02-23 NOTE — Telephone Encounter (Signed)
PLEASE CALL PT. She has H. Pylori gastritis. She has an allergy to PCN and so she needs PYLERA 3 PILLS QID FOR 10 DAYS. She should take OMEPRAZOLE 30 MINS BEFORE MEALS BID. The meds can cause nausea, vomiting, abd cramps, loose stools, black colored stools, and metallic taste in her mouth. SHE HAD ONE SIMPLE ADENOMA REMOVED.   DRINK WATER TO KEEP YOUR URINE LIGHT YELLOW.  CONTINUE YOUR WEIGHT LOSS EFFORTS. LOSE 10 LBS.  FOLLOW A LOW FAT DIET. MEATS SHOULD BE BAKED, BROILED, OR BOILED.  MEATS SHOULD BE CHOPPED OR GROUND ONLY. DO NOT EAT CHUNKS OF ANYTHING.   HOLD IBUPROFEN FOR 7 DAYS.   ZANTAC HELPS MOST WHEN USED AS NEEDED.  Follow up in 4 mos E30 H PYLORI GASTRITIS/GERD.  Next colonoscopy in 5-10 years.

## 2015-02-24 ENCOUNTER — Ambulatory Visit: Payer: No Typology Code available for payment source | Admitting: Orthopedic Surgery

## 2015-02-24 ENCOUNTER — Encounter: Payer: Self-pay | Admitting: Orthopedic Surgery

## 2015-02-24 NOTE — Telephone Encounter (Signed)
ON RECALL  °

## 2015-02-24 NOTE — Telephone Encounter (Signed)
Pt is aware. Sample of Pylera at front and she is aware we close at noon tomorrow.

## 2015-03-17 ENCOUNTER — Telehealth: Payer: Self-pay | Admitting: Orthopedic Surgery

## 2015-03-17 NOTE — Telephone Encounter (Signed)
ok 

## 2015-03-17 NOTE — Telephone Encounter (Signed)
Patient called to really that she had missed her last 2 appointments due to family matter regarding granddaughter.  She said her "right shoulder is good," and she is asking if she can be seen for new problem of left shoulder pain.  She is still covered under the 100% [place] discount through January of 2017; aware that needs approval for new problem.  Her ph#[phone]

## 2015-03-21 NOTE — Telephone Encounter (Signed)
Called back to patient; appointment scheduled. °

## 2015-04-07 ENCOUNTER — Ambulatory Visit: Payer: No Typology Code available for payment source | Admitting: Orthopedic Surgery

## 2015-05-03 ENCOUNTER — Ambulatory Visit: Payer: No Typology Code available for payment source | Admitting: Orthopedic Surgery

## 2015-05-11 ENCOUNTER — Encounter: Payer: Self-pay | Admitting: Gastroenterology

## 2015-05-26 ENCOUNTER — Ambulatory Visit (INDEPENDENT_AMBULATORY_CARE_PROVIDER_SITE_OTHER): Payer: Medicaid Other

## 2015-05-26 ENCOUNTER — Ambulatory Visit (INDEPENDENT_AMBULATORY_CARE_PROVIDER_SITE_OTHER): Payer: No Typology Code available for payment source | Admitting: Orthopedic Surgery

## 2015-05-26 ENCOUNTER — Encounter: Payer: Self-pay | Admitting: Orthopedic Surgery

## 2015-05-26 VITALS — BP 142/82 | Ht 66.0 in | Wt 184.0 lb

## 2015-05-26 DIAGNOSIS — M75102 Unspecified rotator cuff tear or rupture of left shoulder, not specified as traumatic: Secondary | ICD-10-CM

## 2015-05-26 DIAGNOSIS — M25512 Pain in left shoulder: Secondary | ICD-10-CM | POA: Diagnosis not present

## 2015-05-26 MED ORDER — TRAMADOL-ACETAMINOPHEN 37.5-325 MG PO TABS: 1.0000 | ORAL_TABLET | Freq: Four times a day (QID) | ORAL | Status: AC | PRN

## 2015-05-26 NOTE — Patient Instructions (Signed)
Call [facility] therapy dept to schedule therapy visits  Joint Injection Care After Refer to this sheet in the next few days. These instructions provide you with information on caring for yourself after you have had a joint injection. Your caregiver also may give you more specific instructions. Your treatment has been planned according to current medical practices, but problems sometimes occur. Call your caregiver if you have any problems or questions after your procedure. After any type of joint injection, it is not uncommon to experience:  Soreness, swelling, or bruising around the injection site.  Mild numbness, tingling, or weakness around the injection site caused by the numbing medicine used before or with the injection. It also is possible to experience the following effects associated with the specific agent after injection:  Iodine-based contrast agents:  Allergic reaction (itching, hives, widespread redness, and swelling beyond the injection site).  Corticosteroids (These effects are rare.):  Allergic reaction.  Increased blood sugar levels (If you have diabetes and you notice that your blood sugar levels have increased, notify your caregiver).  Increased blood pressure levels.  Mood swings.  Hyaluronic acid in the use of viscosupplementation.  Temporary heat or redness.  Temporary rash and itching.  Increased fluid accumulation in the injected joint. These effects all should resolve within a day after your procedure.  HOME CARE INSTRUCTIONS  Limit yourself to light activity the day of your procedure. Avoid lifting heavy objects, bending, stooping, or twisting.  Take prescription or over-the-counter pain medication as directed by your caregiver.  You may apply ice to your injection site to reduce pain and swelling the day of your procedure. Ice may be applied 03-04 times:  Put ice in a plastic bag.  Place a towel between your skin and the bag.  Leave the ice on for no  longer than 15-20 minutes each time. SEEK IMMEDIATE MEDICAL CARE IF:   Pain and swelling get worse rather than better or extend beyond the injection site.  Numbness does not go away.  Blood or fluid continues to leak from the injection site.  You have chest pain.  You have swelling of your face or tongue.  You have trouble breathing or you become dizzy.  You develop a fever, chills, or severe tenderness at the injection site that last longer than 1 day. MAKE SURE YOU:  Understand these instructions.  Watch your condition.  Get help right away if you are not doing well or if you get worse. Document Released: 01/25/2011 Document Revised: 08/06/2011 Document Reviewed: 01/25/2011 ExitCare Patient Information 2015 [employer]. This information is not intended to replace advice given to you by your health care provider. Make sure you discuss any questions you have with your health care provider.  

## 2015-05-26 NOTE — Progress Notes (Signed)
Chief Complaint  Patient presents with  . Shoulder Pain    Left shoulder pain x 3 months, no known injury     History 59 year old female presents with atraumatic pain in her left shoulder. Quality dull ache, severity moderate ,  Duration 3 months , time and constant context no trauma, modifying factors overhead activity  Review of systems neurologic symptoms negative constitutional symptoms negative  Past Medical History  Diagnosis Date  . Back pain   . Knee pain, bilateral   . Anxiety   . GERD (gastroesophageal reflux disease)   . History of kidney stones   . Arthritis   . Neuropathy   . History of hypertension     non since she stopped drinking 2012; off all meds  . Helicobacter pylori gastritis SEP 2016  . Colon adenomas SEP 2016    BP 142/82 mmHg  Ht 5\' 6"  (1.676 m)  Wt 184 lb (83.462 kg)  BMI 29.71 kg/m2  Overall appearance is normal, oriented 3. Mood normal. Chemistry status noncontributory. Lymph nodes negative left axilla left clavicle area. Meniscal bleed around the shoulder deltoid and acromion. Flexion 120 passive, external rotation 45, stability test normal motor exam rotator cuff strength 5 minus over 5 and skin intact sensation normal pulses good  Impingement sign positive at 120   Data reviewed Independent x-ray interpretation is that the x-ray is normal  My diagnosis is impingement syndrome  My plan is for subacromial injection, Ultracet for pain nothing else. Physical therapy. Follow-up as needed.  Procedure note the subacromial injection shoulder left   Verbal consent was obtained to inject the  Left   Shoulder  Timeout was completed to confirm the injection site is a subacromial space of the  left  shoulder  Medication used Depo-Medrol 40 mg and lidocaine 1% 3 cc  Anesthesia was provided by ethyl chloride  The injection was performed in the left  posterior subacromial space. After pinning the skin with alcohol and anesthetized the skin with ethyl  chloride the subacromial space was injected using a 20-gauge needle. There were no complications  Sterile dressing was applied.

## 2015-06-06 ENCOUNTER — Telehealth: Payer: Self-pay | Admitting: *Deleted

## 2015-06-06 ENCOUNTER — Telehealth (HOSPITAL_COMMUNITY): Payer: Self-pay

## 2015-06-06 ENCOUNTER — Ambulatory Visit (HOSPITAL_COMMUNITY): Payer: Medicaid Other

## 2015-06-06 NOTE — Telephone Encounter (Signed)
Patient called stating the ulracet is not helping her at all, patient said she has tried it for a week and it has not helped. Patient said she is hurting so bad, patient is requesting something else. Please advise [phone]

## 2015-06-06 NOTE — Telephone Encounter (Signed)
Pt called back cx and rescheduled until 1/12 due to snow. NF 06/05/14 11:17

## 2015-06-06 NOTE — Telephone Encounter (Signed)
ROUTING TO DR HARRISON 

## 2015-06-07 NOTE — Telephone Encounter (Signed)
Routing to Jaime  

## 2015-06-07 NOTE — Telephone Encounter (Signed)
Declined   Refer to chronic pain management

## 2015-06-08 NOTE — Telephone Encounter (Signed)
Per Medicaid's online portal, [place] [employer], patient's plan for current month of January 2017 is [employer], with Primary care provider of [place]; therefore, this provider would need to refer patient.

## 2015-06-08 NOTE — Telephone Encounter (Signed)
Patient opts to have pain management referral, is aware that we will research and advise if PCP will need to make referral due to insurance reasons

## 2015-06-09 ENCOUNTER — Ambulatory Visit (HOSPITAL_COMMUNITY): Payer: Medicaid Other | Attending: Orthopedic Surgery

## 2015-06-09 ENCOUNTER — Encounter (HOSPITAL_COMMUNITY): Payer: Self-pay | Admitting: Emergency Medicine

## 2015-06-09 ENCOUNTER — Emergency Department (HOSPITAL_COMMUNITY)
Admission: EM | Admit: 2015-06-09 | Discharge: 2015-06-09 | Discharge status: Against Medical Advice | Payer: Medicaid Other | Attending: Emergency Medicine | Admitting: Emergency Medicine

## 2015-06-09 DIAGNOSIS — G8929 Other chronic pain: Secondary | ICD-10-CM | POA: Insufficient documentation

## 2015-06-09 DIAGNOSIS — R29898 Other symptoms and signs involving the musculoskeletal system: Secondary | ICD-10-CM | POA: Insufficient documentation

## 2015-06-09 DIAGNOSIS — M25512 Pain in left shoulder: Secondary | ICD-10-CM | POA: Diagnosis present

## 2015-06-09 DIAGNOSIS — M79622 Pain in left upper arm: Secondary | ICD-10-CM | POA: Insufficient documentation

## 2015-06-09 DIAGNOSIS — F17210 Nicotine dependence, cigarettes, uncomplicated: Secondary | ICD-10-CM | POA: Insufficient documentation

## 2015-06-09 DIAGNOSIS — M25612 Stiffness of left shoulder, not elsewhere classified: Secondary | ICD-10-CM | POA: Insufficient documentation

## 2015-06-09 DIAGNOSIS — M75102 Unspecified rotator cuff tear or rupture of left shoulder, not specified as traumatic: Secondary | ICD-10-CM | POA: Diagnosis not present

## 2015-06-09 HISTORY — DX: Other chronic pain: G89.29

## 2015-06-09 HISTORY — DX: Pain in unspecified shoulder: M25.519

## 2015-06-09 HISTORY — DX: Unspecified rotator cuff tear or rupture of left shoulder, not specified as traumatic: M75.102

## 2015-06-09 NOTE — ED Notes (Signed)
Left upper arm pain x 4 mo, states constant pain. Reaching makes pain worse. Unable to lift above head, per pt. Sore to the touch. States pain is between the shoulder and elbow.

## 2015-06-09 NOTE — Therapy (Signed)
[place], [place], [address] Phone: [phone]   Fax:  [phone]  Occupational Therapy Evaluation  Patient Details  Name: Nancy Espinoza MRN: [id number] Date of Birth: Dec 27, 1955 Referring Provider: Aline Brochure  Encounter Date: 06/09/2015      OT End of Session - 06/09/15 1702    Visit Number 1   Number of Visits 1   Authorization Type Medicaid   Authorization Time Period 1 eval only approved per year.   Authorization - Visit Number 1   Authorization - Number of Visits 1   OT Start Time 1440   OT Stop Time 1517   OT Time Calculation (min) 37 min   Activity Tolerance Patient tolerated treatment well;Patient limited by pain   Behavior During Therapy [facility] for tasks assessed/performed      Past Medical History  Diagnosis Date  . Back pain   . Knee pain, bilateral   . Anxiety   . GERD (gastroesophageal reflux disease)   . History of kidney stones   . Arthritis   . Neuropathy ([place])   . History of hypertension     non since she stopped drinking 2012; off all meds  . Helicobacter pylori gastritis SEP 2016  . Colon adenomas SEP 2016  . Chronic shoulder pain   . Rotator cuff syndrome of left shoulder     Past Surgical History  Procedure Laterality Date  . Abdominal hysterectomy    . Tubal ligation    . Colonoscopy with propofol N/A 02/22/2015    Procedure: COLONOSCOPY WITH PROPOFOL;  Surgeon: Danie Binder, MD;  Location: [facility];  Service: Endoscopy;  Laterality: N/A;  procedure 1 cecum time in 1123  time out  1132  total time 9 minutes  . Esophagogastroduodenoscopy (egd) with propofol N/A 02/22/2015    Procedure: ESOPHAGOGASTRODUODENOSCOPY (EGD) WITH PROPOFOL;  Surgeon: Danie Binder, MD;  Location: [facility];  Service: Endoscopy;  Laterality: N/A;  . Savory dilation N/A 02/22/2015    Procedure:  ESOPHAGEAL DILATION;  Surgeon: Danie Binder, MD;  Location: [facility];  Service: Endoscopy;  Laterality: N/A;   Savory 15/16  . Polypectomy N/A 02/22/2015    Procedure: POLYPECTOMY;  Surgeon: Danie Binder, MD;  Location: [facility];  Service: Endoscopy;  Laterality: N/A;  transverse colon  . Esophageal biopsy  02/22/2015    Procedure: BIOPSY;  Surgeon: Danie Binder, MD;  Location: [facility];  Service: Endoscopy;;  gastric    There were no vitals filed for this visit.  Visit Diagnosis:  Rotator cuff syndrome, left - Plan: Ot plan of care cert/re-cert  Pain in joint of left shoulder - Plan: Ot plan of care cert/re-cert  Shoulder weakness - Plan: Ot plan of care cert/re-cert  Shoulder stiffness, left - Plan: Ot plan of care cert/re-cert      Subjective Assessment - 06/09/15 1540    Subjective  S: I can't do anything to my left arm. Nothing out to the side or over my head.   Pertinent History Patient is a 60 y/o female S/P left rotator cuff injury that has been ongoing for approximately 4 months now. Patient reports no injury. Patient reports that right now she is experiencing no pain as she had teeth pulled recently from the dentist and she has been taking pain medication that was prescribed. Without pain medication pain is unbearable. Dr. Aline Brochure has referred patient for occupational therapy for evaluation and treatment.    Patient Stated Goals  To decrease pain and increase use of my left arm.   Currently in Pain? No/denies  Pt reports that she has been taken medication for her teeth being pulled.           [facility] OT Assessment - 06/09/15 1440    Assessment   Diagnosis Left rotator cuff syndrome   Referring Provider harrison   Onset Date --  Approximately 4 months ago   Prior Therapy None on left arm. Patient was recently seen for same condition affecting the right UE at [place].    Precautions   Precautions None   Restrictions   Weight Bearing Restrictions No   Balance Screen   Has the patient fallen in the past 6 months No   Has the patient had a decrease in activity level because  of a fear of falling?  No   Is the patient reluctant to leave their home because of a fear of falling?  No   Home  Environment   Family/patient expects to be discharged to: Private residence   Living Arrangements Alone   Prior Function   Level of Independence Independent with basic ADLs;Independent with gait   Vocation --  requesting disability   Leisure Patient has 5 children and 22 grandchildren.   ADL   ADL comments Patient has difficulty reaching out to the side and overhead for items.   Mobility   Mobility Status Independent   Written Expression   Dominant Hand Right   Vision - History   Baseline Vision Wears glasses all the time   Cognition   Overall Cognitive Status Within Functional Limits for tasks assessed   ROM / Strength   AROM / PROM / Strength AROM;PROM;Strength   Palpation   Palpation comment Mod fascial restrictions in left deltoid region.   AROM   Overall AROM Comments Assessed seated. IR/er adducted. Patient able to complete 50% shoulder flexion, 25% abduction, and WFL range for external and internal rotation. Increased pain noted for flexion and abduction   AROM Assessment Site Shoulder   Right/Left Shoulder Left   PROM   Overall PROM  Within functional limits for tasks performed   Overall PROM Comments Assessed supine. IR/er adducted   PROM Assessment Site Shoulder   Right/Left Shoulder Left   Strength   Overall Strength Comments Assessed seated. IR/er adducted   Strength Assessment Site Shoulder   Right/Left Shoulder Left   Left Shoulder Flexion 3-/5   Left Shoulder ABduction 3-/5   Left Shoulder Internal Rotation 3/5   Left Shoulder External Rotation 3/5            OT Education - 06/09/15 1700    Education provided Yes   Education Details patietn was given an extensive HEP with education regarding completion of self myofascial release, table slides, scapular A/ROM, AA/ROM, and A/ROM. Patient was educated on frequency and duration of all  exercises and at what timeframe to progress.    Person(s) Educated Patient   Methods Explanation;Demonstration;Verbal cues;Handout   Comprehension Verbalized understanding          OT Short Term Goals - 06/09/15 1705    OT SHORT TERM GOAL #1   Title patient will be educated and independent with HEP to increase functional use of LUE during daily tasks.    Time 1   Period Weeks   Status Achieved               Plan - 06/09/15 1702    Clinical Impression Statement  A: Patient is a 60 y/o female S/P left rotator cuff syndrome causing increased pain and fascial restrictions and decreased strength and ROM resulting in difficulty completing daily tasks using LUE. Due to Medicaid only covering the evaluation only and patient is unable to pay for services out of pocket patient was given an extensive HEP to complete independently at home.    Pt will benefit from skilled therapeutic intervention in order to improve on the following deficits (Retired) Pain;Decreased strength;Increased fascial restricitons;Decreased range of motion   Rehab Potential Good   OT Frequency One time visit   OT Treatment/Interventions Patient/family education   Plan P: Evaluation only with HEP.    Consulted and Agree with Plan of Care Patient        Problem List Patient Active Problem List   Diagnosis Date Noted  . GERD (gastroesophageal reflux disease) 02/16/2015  . Dysphagia 02/16/2015  . Encounter for screening colonoscopy 11/17/2014  . Osteoarthritis, knee 11/06/2012  . Contusion of bone 07/23/2012    Ailene Ravel, OTR/L,CBIS  [phone]  06/09/2015, 5:08 PM  [place] [address] [place], [place], [address] Phone: [phone]   Fax:  [phone]  Name: Nancy Espinoza MRN: [id number] Date of Birth: Jun 01, 1955

## 2015-06-09 NOTE — Patient Instructions (Signed)
Use a "tennis ball" to massage muscle knots.   Complete the table slides and scapular exercises 2-3X a day for 4 weeks.  -Spend 1-2 minutes on each exercise.  SHOULDER: Flexion On Table   Place hands on table, elbows straight. Move hips away from body. Press hands down into table.   Abduction (Passive)   With arm out to side, resting on table, lower head toward arm, keeping trunk away from table.  Copyright  VHI. All rights reserved.     Internal Rotation (Assistive)   Seated with elbow bent at right angle and held against side, slide arm on table surface in an inward arc. Activity: Use this motion to brush crumbs off the table.  Copyright  VHI. All rights reserved.  1) Seated Row   Sit up straight with elbows by your sides. Pull back with shoulders/elbows, keeping forearms straight, as if pulling back on the reins of a horse. Squeeze shoulder blades together. Repeat _10-12__times, ____sets/day    2) Shoulder Elevation    Sit up straight with arms by your sides. Slowly bring your shoulders up towards your ears. Repeat_10-12__times, ____ sets/day    3) Shoulder Extension    Sit up straight with both arms by your side, draw your arms back behind your waist. Keep your elbows straight. Repeat __10-12__times, ____sets/day.   *After 4 weeks complete the exercises with with the cane. Either seated or laying down. 2-3X a week. For 4 more weeks.  Perform each exercise ___10-12_____ reps. 2-3x days.   Protraction - STANDING  Start by holding a wand or cane at chest height.  Next, slowly push the wand outwards in front of your body so that your elbows become fully straightened. Then, return to the original position.     Shoulder FLEXION - STANDING - PALMS UP  In the standing position, hold a wand/cane with both arms, palms up on both sides. Raise up the wand/cane allowing your unaffected arm to perform most of the effort. Your affected arm should be partially  relaxed.      Internal/External ROTATION - STANDING  In the standing position, hold a wand/cane with both hands keeping your elbows bent. Move your arms and wand/cane to one side.  Your affected arm should be partially relaxed while your unaffected arm performs most of the effort.       Shoulder ABDUCTION - STANDING  While holding a wand/cane palm face up on the injured side and palm face down on the uninjured side, slowly raise up your injured arm to the side.      Horizontal Abduction/Adduction      *After 4 more weeks transition to Active Range of Motion exercises without the cane.  1) Shoulder Protraction    Begin with elbows by your side, slowly "punch" straight out in front of you. Repeat 10-12_times, ____set/day.     2) Shoulder Flexion  Supine:     Standing:         Begin with arms at your side with thumbs pointed up, slowly raise both arms up and forward towards overhead. Repeat_10-12__times, ___set/day.      3) Horizontal abduction/adduction  Supine:   Standing:           Begin with arms straight out in front of you, bring out to the side in at "T" shape. Keep arms straight entire time. Repeat _10-12___times, ____sets/day.      4) Internal & External Rotation    *No band* -Stand with elbows at the side  and elbows bent 90 degrees. Move your forearms away from your body, then bring back inward toward the body.  Repeat _10-12__times, ___sets/day    5) Shoulder Abduction  Supine:     Standing:       Lying on your back begin with your arms flat on the table next to your side. Slowly move your arms out to the side so that they go overhead, in a jumping jack or snow angel movement. Repeat _10-12__times, ___sets/day      Straight arms holding cane at shoulder height, bring cane to right, center, left. Repeat starting to left.   Copyright  VHI. All rights reserved.

## 2015-06-09 NOTE — ED Notes (Signed)
Patient stated that she could not wait any longer and had to leave.  Patient left before being seen.

## 2015-06-16 ENCOUNTER — Telehealth: Payer: Self-pay | Admitting: Orthopedic Surgery

## 2015-06-16 NOTE — Telephone Encounter (Signed)
Call from patient regarding physical therapy, and the fact that her insurance of Medicaid, covers only the initial therapy evaluation.  She has had this done at [facility], 06/09/15 and was given home exercise program.  She is asking what else she can do for this shoulder pain.  Said she is aware she can no longer receive pain medication.  Please advise of any other recommendations.  Ph [phone]

## 2015-06-16 NOTE — Telephone Encounter (Signed)
Routing to Dr Bill Salinas- PCP will have to refer to pain management

## 2015-06-20 NOTE — Telephone Encounter (Signed)
PATIENT AWARE

## 2015-09-21 ENCOUNTER — Emergency Department (HOSPITAL_COMMUNITY)
Admission: EM | Admit: 2015-09-21 | Discharge: 2015-09-21 | Disposition: A | Discharge status: Home / Self Care | Payer: Medicaid Other | Attending: Emergency Medicine | Admitting: Emergency Medicine

## 2015-09-21 ENCOUNTER — Encounter (HOSPITAL_COMMUNITY): Payer: Self-pay | Admitting: *Deleted

## 2015-09-21 ENCOUNTER — Emergency Department (HOSPITAL_COMMUNITY): Payer: Medicaid Other

## 2015-09-21 DIAGNOSIS — F17210 Nicotine dependence, cigarettes, uncomplicated: Secondary | ICD-10-CM | POA: Diagnosis not present

## 2015-09-21 DIAGNOSIS — Z79899 Other long term (current) drug therapy: Secondary | ICD-10-CM | POA: Insufficient documentation

## 2015-09-21 DIAGNOSIS — M1611 Unilateral primary osteoarthritis, right hip: Secondary | ICD-10-CM

## 2015-09-21 DIAGNOSIS — I10 Essential (primary) hypertension: Secondary | ICD-10-CM | POA: Diagnosis not present

## 2015-09-21 DIAGNOSIS — R1031 Right lower quadrant pain: Secondary | ICD-10-CM | POA: Diagnosis present

## 2015-09-21 MED ORDER — IBUPROFEN 600 MG PO TABS: 600.0000 mg | ORAL_TABLET | Freq: Four times a day (QID) | ORAL | Status: AC | PRN

## 2015-09-21 MED ORDER — HYDROCODONE-ACETAMINOPHEN 5-325 MG PO TABS: 1.0000 | ORAL_TABLET | ORAL | Status: AC | PRN

## 2015-09-21 MED ORDER — KETOROLAC TROMETHAMINE 60 MG/2ML IM SOLN
60.0000 mg | Freq: Once | INTRAMUSCULAR | Status: AC
  Administered 2015-09-21: 60 mg via INTRAMUSCULAR
  Filled 2015-09-21: qty 2

## 2015-09-21 NOTE — ED Notes (Signed)
Pt made aware to return if symptoms worsen or if any life threatening symptoms occur.   

## 2015-09-21 NOTE — ED Notes (Signed)
Patient reports right sided groin pain x 2 days. Denies injury.

## 2015-09-21 NOTE — Discharge Instructions (Signed)
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
## 2015-10-04 ENCOUNTER — Ambulatory Visit: Payer: Medicaid Other | Admitting: Orthopedic Surgery

## 2015-10-05 ENCOUNTER — Ambulatory Visit: Payer: Medicaid Other | Admitting: Orthopedic Surgery

## 2016-03-08 ENCOUNTER — Ambulatory Visit: Payer: Medicaid Other | Admitting: Anesthesiology

## 2016-03-28 ENCOUNTER — Ambulatory Visit: Payer: Medicaid Other | Admitting: Anesthesiology

## 2016-11-01 ENCOUNTER — Other Ambulatory Visit: Payer: Self-pay | Admitting: Orthopedic Surgery

## 2016-11-01 DIAGNOSIS — M19072 Primary osteoarthritis, left ankle and foot: Secondary | ICD-10-CM

## 2016-11-09 ENCOUNTER — Other Ambulatory Visit: Payer: Self-pay | Admitting: Orthopedic Surgery

## 2016-11-09 ENCOUNTER — Ambulatory Visit
Admission: RE | Admit: 2016-11-09 | Discharge: 2016-11-09 | Disposition: A | Discharge status: Home / Self Care | Payer: Medicaid Other | Source: Ambulatory Visit | Attending: Orthopedic Surgery | Admitting: Orthopedic Surgery

## 2016-11-09 DIAGNOSIS — M19072 Primary osteoarthritis, left ankle and foot: Secondary | ICD-10-CM

## 2016-11-09 MED ORDER — METHYLPREDNISOLONE ACETATE 40 MG/ML INJ SUSP (RADIOLOG
40.0000 mg | Freq: Once | INTRAMUSCULAR | Status: AC
  Administered 2016-11-09: 40 mg via INTRA_ARTICULAR

## 2016-11-09 MED ORDER — IOPAMIDOL (ISOVUE-M 200) INJECTION 41%
1.0000 mL | Freq: Once | INTRAMUSCULAR | Status: AC
  Administered 2016-11-09: 1 mL via INTRA_ARTICULAR

## 2017-12-23 IMAGING — XA DG FLUORO GUIDE NDL PLC/BX
2 series · 2 of 2 positions shown · non-contrast
Comparison: none

CLINICAL DATA: LEFT foot pain.

[Series 1: ortho standard · 1 of 1 slices shown (1 of 2)]
[im 1/1]
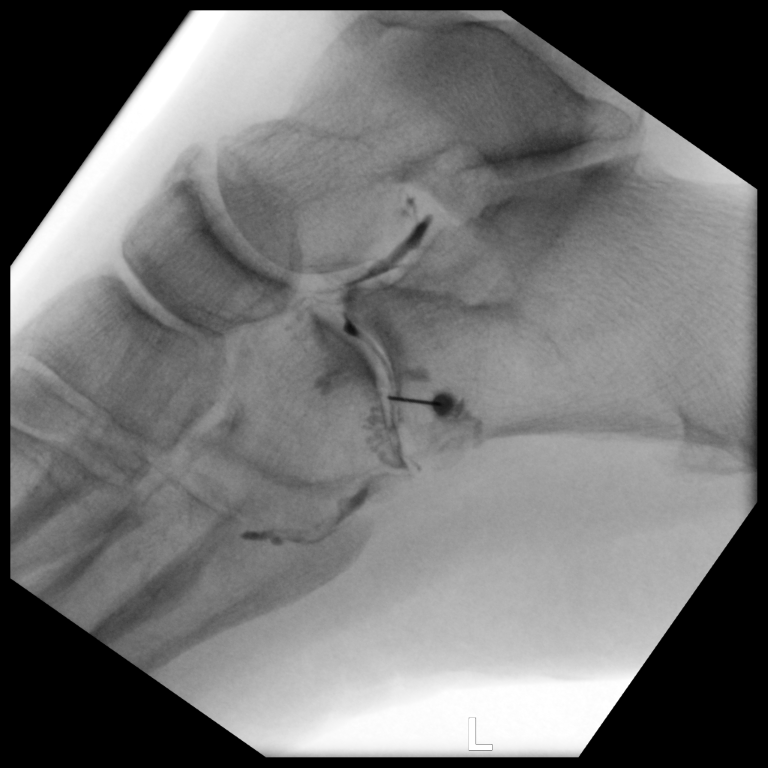

[Series 2: ortho standard · 1 of 1 slices shown (2 of 2)]
[im 1/1]
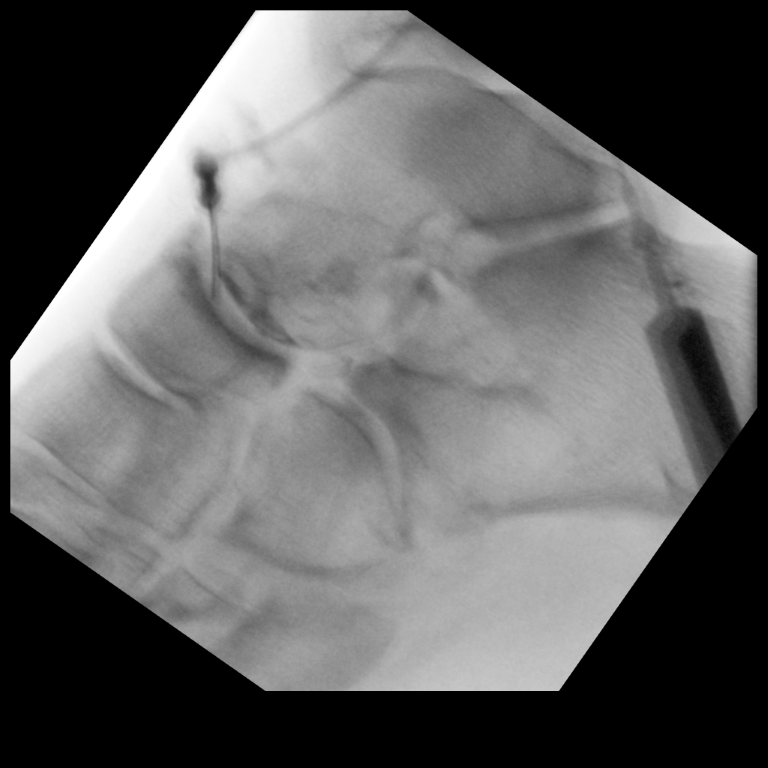

[2 of 2 positions shown; findings below may reference images not displayed]

FLUOROSCOPY TIME:  43 seconds corresponding to a Dose Area Product
of 7.24 ?Gy*m2

PROCEDURE:
LEFT TALONAVICULAR JOINT INJECTION UNDER FLUOROSCOPY

LEFT CALCANEOCUBOID JOINT INJECTION UNDER FLUOROSCOPY

An appropriate skin entrance site was determined. The site was
marked, prepped with Betadine, draped in the usual sterile fashion,
and infiltrated locally with 1% Lidocaine.

Attention was first directed to the inferior joint. A 25 gauge
hypodermic needle was placed between the calcaneus and cuboid
without difficulty. Contrast injection showed spread throughout the
midfoot. 20 mg of Depo-Medrol mixed with 1 mL of 1% lidocaine was
administered. No immediate complication.

Attention was next directed to the superior joint. A 25 gauge
hypodermic needle was placed between the talus and the navicular
bone without difficulty. Contrast injection showed spread limited to
that joint. 20 mg of Depo-Medrol mixed with 1 mL of 1% lidocaine was
administered. No immediate complication.
IMPRESSION: Technically successful LEFT talonavicular joint and calcaneocuboid
joint injection under fluoroscopy.

## 2020-01-27 ENCOUNTER — Encounter: Payer: Self-pay | Admitting: Internal Medicine

## 2020-09-27 ENCOUNTER — Other Ambulatory Visit (HOSPITAL_COMMUNITY): Payer: Self-pay | Admitting: Internal Medicine

## 2020-09-27 ENCOUNTER — Other Ambulatory Visit: Payer: Self-pay | Admitting: Internal Medicine

## 2020-09-27 DIAGNOSIS — Z122 Encounter for screening for malignant neoplasm of respiratory organs: Secondary | ICD-10-CM

## 2020-09-27 DIAGNOSIS — F17200 Nicotine dependence, unspecified, uncomplicated: Secondary | ICD-10-CM

## 2021-02-07 ENCOUNTER — Encounter (HOSPITAL_COMMUNITY): Payer: Self-pay

## 2021-02-07 ENCOUNTER — Ambulatory Visit (HOSPITAL_COMMUNITY): Admission: RE | Admit: 2021-02-07 | Payer: Medicare Other | Source: Ambulatory Visit

## 2022-02-28 ENCOUNTER — Other Ambulatory Visit (HOSPITAL_COMMUNITY): Payer: Self-pay | Admitting: Internal Medicine

## 2022-02-28 DIAGNOSIS — Z1231 Encounter for screening mammogram for malignant neoplasm of breast: Secondary | ICD-10-CM

## 2022-03-07 ENCOUNTER — Ambulatory Visit (HOSPITAL_COMMUNITY): Payer: Medicare Other

## 2022-12-03 ENCOUNTER — Encounter: Payer: Self-pay | Admitting: *Deleted

## 2022-12-04 ENCOUNTER — Other Ambulatory Visit (HOSPITAL_COMMUNITY): Payer: Self-pay | Admitting: Internal Medicine

## 2022-12-04 DIAGNOSIS — Z1231 Encounter for screening mammogram for malignant neoplasm of breast: Secondary | ICD-10-CM

## 2023-06-06 ENCOUNTER — Encounter: Payer: Self-pay | Admitting: *Deleted

## 2023-10-29 ENCOUNTER — Emergency Department (HOSPITAL_COMMUNITY)
Admission: EM | Admit: 2023-10-29 | Discharge: 2023-10-29 | Disposition: A | Discharge status: Home / Self Care | Attending: Student | Admitting: Student

## 2023-10-29 ENCOUNTER — Other Ambulatory Visit: Payer: Self-pay

## 2023-10-29 DIAGNOSIS — E876 Hypokalemia: Secondary | ICD-10-CM | POA: Insufficient documentation

## 2023-10-29 DIAGNOSIS — E871 Hypo-osmolality and hyponatremia: Secondary | ICD-10-CM | POA: Insufficient documentation

## 2023-10-29 DIAGNOSIS — R531 Weakness: Secondary | ICD-10-CM | POA: Insufficient documentation

## 2023-10-29 DIAGNOSIS — I10 Essential (primary) hypertension: Secondary | ICD-10-CM | POA: Diagnosis not present

## 2023-10-29 DIAGNOSIS — F1910 Other psychoactive substance abuse, uncomplicated: Secondary | ICD-10-CM | POA: Insufficient documentation

## 2023-10-29 DIAGNOSIS — F17210 Nicotine dependence, cigarettes, uncomplicated: Secondary | ICD-10-CM | POA: Insufficient documentation

## 2023-10-29 DIAGNOSIS — Z79899 Other long term (current) drug therapy: Secondary | ICD-10-CM | POA: Diagnosis not present

## 2023-10-29 LAB — COMPREHENSIVE METABOLIC PANEL WITH GFR
ALT: 13 U/L (ref 0–44)
AST: 36 U/L (ref 15–41)
Albumin: 3.8 g/dL (ref 3.5–5.0)
Alkaline Phosphatase: 73 U/L (ref 38–126)
Anion gap: 11 (ref 5–15)
BUN: 16 mg/dL (ref 8–23)
CO2: 20 mmol/L — ABNORMAL LOW (ref 22–32)
Calcium: 8.8 mg/dL — ABNORMAL LOW (ref 8.9–10.3)
Chloride: 95 mmol/L — ABNORMAL LOW (ref 98–111)
Creatinine, Ser: 1.35 mg/dL — ABNORMAL HIGH (ref 0.44–1.00)
GFR, Estimated: 43 mL/min — ABNORMAL LOW (ref >60)
Glucose, Bld: 112 mg/dL — ABNORMAL HIGH (ref 70–99)
Potassium: 2.7 mmol/L — CL (ref 3.5–5.1)
Sodium: 126 mmol/L — ABNORMAL LOW (ref 135–145)
Total Bilirubin: 0.6 mg/dL (ref 0.0–1.2)
Total Protein: 7.6 g/dL (ref 6.5–8.1)

## 2023-10-29 LAB — URINALYSIS, ROUTINE W REFLEX MICROSCOPIC
Bilirubin Urine: NEGATIVE
Glucose, UA: NEGATIVE mg/dL
Hgb urine dipstick: NEGATIVE
Ketones, ur: NEGATIVE mg/dL
Nitrite: NEGATIVE
Protein, ur: NEGATIVE mg/dL
Specific Gravity, Urine: 1.012 (ref 1.005–1.030)
pH: 6 (ref 5.0–8.0)

## 2023-10-29 LAB — ETHANOL: Alcohol, Ethyl (B): <15 mg/dL (ref <15)

## 2023-10-29 LAB — RAPID URINE DRUG SCREEN, HOSP PERFORMED
Amphetamines: NOT DETECTED
Barbiturates: NOT DETECTED
Benzodiazepines: NOT DETECTED
Cocaine: NOT DETECTED
Opiates: NOT DETECTED
Tetrahydrocannabinol: POSITIVE — AB

## 2023-10-29 LAB — CBC
HCT: 36.6 % (ref 36.0–46.0)
Hemoglobin: 13 g/dL (ref 12.0–15.0)
MCH: 32.3 pg (ref 26.0–34.0)
MCHC: 35.5 g/dL (ref 30.0–36.0)
MCV: 91 fL (ref 80.0–100.0)
Platelets: 369 10*3/uL (ref 150–400)
RBC: 4.02 MIL/uL (ref 3.87–5.11)
RDW: 12.6 % (ref 11.5–15.5)
WBC: 7.8 10*3/uL (ref 4.0–10.5)
nRBC: 0 % (ref 0.0–0.2)

## 2023-10-29 LAB — CBG MONITORING, ED: Glucose-Capillary: 118 mg/dL — ABNORMAL HIGH (ref 70–99)

## 2023-10-29 MED ORDER — POTASSIUM CHLORIDE CRYS ER 20 MEQ PO TBCR
40.0000 meq | EXTENDED_RELEASE_TABLET | Freq: Once | ORAL | Status: AC
  Administered 2023-10-29: 40 meq via ORAL
  Filled 2023-10-29: qty 2

## 2023-10-29 MED ORDER — MAGNESIUM OXIDE -MG SUPPLEMENT 400 (240 MG) MG PO TABS
800.0000 mg | ORAL_TABLET | Freq: Once | ORAL | Status: AC
  Administered 2023-10-29: 800 mg via ORAL
  Filled 2023-10-29: qty 2

## 2023-10-29 MED ORDER — LACTATED RINGERS IV BOLUS
1000.0000 mL | Freq: Once | INTRAVENOUS | Status: AC
  Administered 2023-10-29: 1000 mL via INTRAVENOUS

## 2023-10-29 MED ORDER — POTASSIUM CHLORIDE 10 MEQ/100ML IV SOLN
10.0000 meq | Freq: Once | INTRAVENOUS | Status: AC
  Administered 2023-10-29: 10 meq via INTRAVENOUS
  Filled 2023-10-29: qty 100

## 2023-10-29 MED ORDER — FOSFOMYCIN TROMETHAMINE 3 G PO PACK
3.0000 g | PACK | Freq: Once | ORAL | Status: AC
  Administered 2023-10-29: 3 g via ORAL
  Filled 2023-10-29: qty 3

## 2023-10-29 MED ORDER — POTASSIUM CHLORIDE CRYS ER 20 MEQ PO TBCR: 20.0000 meq | EXTENDED_RELEASE_TABLET | Freq: Two times a day (BID) | ORAL | 0 refills | Status: AC

## 2023-10-29 NOTE — ED Notes (Signed)
 Pt c/o left leg cramping. She stood up on it and warm pack was provided. Kellogg RN

## 2023-10-29 NOTE — ED Provider Notes (Signed)
 [place] [facility] EMERGENCY DEPARTMENT AT [facility] Provider Note  CSN: [id number] Arrival date & time: 10/29/23 1648  Chief Complaint(s) Weakness  HPI Nancy Espinoza is a 68 y.o. female who presents emerged part for evaluation of generalized fatigue and somnolence.  She states that she was having trouble sleeping so she borrowed 3 Klonopin off of a friend,ate a delta 8 THC gummy and smoked marijuana prior to arrival.  She states that she feels excessively drowsy and is concerned that she may be too high.  Denies abdominal pain, nausea, vomiting, chest pain, shortness of breath or other systemic symptoms.   Past Medical History Past Medical History:  Diagnosis Date   Anxiety    Arthritis    Back pain    Chronic shoulder pain    Colon adenomas SEP 2016   GERD (gastroesophageal reflux disease)    Helicobacter pylori gastritis SEP 2016   History of hypertension    non since she stopped drinking 2012; off all meds   History of kidney stones    Knee pain, bilateral    Neuropathy    Rotator cuff syndrome of left shoulder    Patient Active Problem List   Diagnosis Date Noted   GERD (gastroesophageal reflux disease) 02/16/2015   Dysphagia 02/16/2015   Encounter for screening colonoscopy 11/17/2014   Osteoarthritis, knee 11/06/2012   Contusion of bone 07/23/2012   Home Medication(s) Prior to Admission medications   Medication Sig Start Date End Date Taking? Authorizing Provider  potassium chloride SA (KLOR-CON M) 20 MEQ tablet Take 1 tablet (20 mEq total) by mouth 2 (two) times daily for 7 days. 10/29/23 11/05/23 Yes Isidore Margraf, MD  clonazePAM (KLONOPIN) 1 MG tablet Take 1 mg by mouth 3 (three) times daily.     [provider]  gabapentin (NEURONTIN) 300 MG capsule Take 300 mg by mouth 3 (three) times daily as needed (pain).    [provider]  HYDROcodone -acetaminophen  (NORCO/VICODIN) 5-325 MG tablet Take 1 tablet by mouth every 4 (four) hours as needed.  09/21/15   Idol, Julie, PA-C  ibuprofen  (ADVIL ,MOTRIN ) 600 MG tablet Take 1 tablet (600 mg total) by mouth every 6 (six) hours as needed. 09/21/15   Idol, Julie, PA-C  omeprazole  (PRILOSEC) 20 MG capsule 1 po bid 30 minutes before meals for 3 mos then once daily FOREVER Patient not taking: Reported on 09/21/2015 02/22/15   Fields, Sandi L, MD  PARoxetine (PAXIL) 40 MG tablet Take 40 mg by mouth daily.    [provider]  ranitidine (ZANTAC) 75 MG tablet Take 75 mg by mouth daily as needed for heartburn.    [provider]  traMADol -acetaminophen  (ULTRACET ) 37.5-325 MG tablet Take 1 tablet by mouth every 6 (six) hours as needed. Patient not taking: Reported on 09/21/2015 05/26/15   Darrin Emerald, MD  zolpidem (AMBIEN) 10 MG tablet Take 10 mg by mouth at bedtime as needed for sleep.    [provider]  Past Surgical History Past Surgical History:  Procedure Laterality Date   ABDOMINAL HYSTERECTOMY     BIOPSY  02/22/2015   Procedure: BIOPSY;  Surgeon: Alyce Jubilee, MD;  Location: [facility];  Service: Endoscopy;;  gastric   COLONOSCOPY WITH PROPOFOL  N/A 02/22/2015   Procedure: COLONOSCOPY WITH PROPOFOL ;  Surgeon: Alyce Jubilee, MD;  Location: [facility];  Service: Endoscopy;  Laterality: N/A;  procedure 1 cecum time in 1123  time out  1132  total time 9 minutes   ESOPHAGOGASTRODUODENOSCOPY (EGD) WITH PROPOFOL  N/A 02/22/2015   Procedure: ESOPHAGOGASTRODUODENOSCOPY (EGD) WITH PROPOFOL ;  Surgeon: Alyce Jubilee, MD;  Location: [facility];  Service: Endoscopy;  Laterality: N/A;   POLYPECTOMY N/A 02/22/2015   Procedure: POLYPECTOMY;  Surgeon: Alyce Jubilee, MD;  Location: [facility];  Service: Endoscopy;  Laterality: N/A;  transverse colon   SAVORY DILATION N/A 02/22/2015   Procedure:  ESOPHAGEAL DILATION;  Surgeon: Alyce Jubilee, MD;  Location: [facility];   Service: Endoscopy;  Laterality: N/A;  Dixie Frederickson 15/16   TUBAL LIGATION     Family History Family History  Problem Relation Age of Onset   Colon cancer Neg Hx     Social History Social History   Tobacco Use   Smoking status: Every Day    Current packs/day: 1.00    Average packs/day: 1 pack/day for 40.0 years (40.0 ttl pk-yrs)    Types: Cigarettes   Smokeless tobacco: Never  Substance Use Topics   Alcohol use: Yes    Alcohol/week: 1.0 standard drink of alcohol    Types: 1 Cans of beer per week    Comment: Stopped 4 years ago, previously drank 6-pack a night.   Drug use: No   Allergies Codeine, Lisinopril, Tetracyclines & related, Erythromycin, and Penicillins  Review of Systems Review of Systems  Constitutional:  Positive for fatigue.    Physical Exam Vital Signs  I have reviewed the triage vital signs BP (!) 126/94 (BP Location: Left Arm)   Pulse (!) 105   Temp 98 F (36.7 C) (Oral)   Resp (!) 21   SpO2 94%   Physical Exam Vitals and nursing note reviewed.  Constitutional:      General: She is not in acute distress.    Appearance: She is well-developed.  HENT:     Head: Normocephalic and atraumatic.  Eyes:     Conjunctiva/sclera: Conjunctivae normal.  Cardiovascular:     Rate and Rhythm: Normal rate and regular rhythm.     Heart sounds: No murmur heard. Pulmonary:     Effort: Pulmonary effort is normal. No respiratory distress.     Breath sounds: Normal breath sounds.  Abdominal:     Palpations: Abdomen is soft.     Tenderness: There is no abdominal tenderness.  Musculoskeletal:        General: No swelling.     Cervical back: Neck supple.  Skin:    General: Skin is warm and dry.     Capillary Refill: Capillary refill takes less than 2 seconds.  Neurological:     Mental Status: She is alert.  Psychiatric:        Mood and Affect: Mood normal.     ED Results and Treatments Labs (all labs ordered are listed, but only abnormal results are  displayed) Labs Reviewed  COMPREHENSIVE METABOLIC PANEL WITH GFR - Abnormal; Notable for the following components:      Result Value   Sodium 126 (*)    Potassium 2.7 (*)  Chloride 95 (*)    CO2 20 (*)    Glucose, Bld 112 (*)    Creatinine, Ser 1.35 (*)    Calcium 8.8 (*)    GFR, Estimated 43 (*)    All other components within normal limits  URINALYSIS, ROUTINE W REFLEX MICROSCOPIC - Abnormal; Notable for the following components:   Leukocytes,Ua MODERATE (*)    Bacteria, UA RARE (*)    All other components within normal limits  RAPID URINE DRUG SCREEN, HOSP PERFORMED - Abnormal; Notable for the following components:   Tetrahydrocannabinol POSITIVE (*)    All other components within normal limits  CBG MONITORING, ED - Abnormal; Notable for the following components:   Glucose-Capillary 118 (*)    All other components within normal limits  URINE CULTURE  CBC  ETHANOL                                                                                                                          Radiology No results found.  Pertinent labs & imaging results that were available during my care of the patient were reviewed by me and considered in my medical decision making (see MDM for details).  Medications Ordered in ED Medications  lactated ringers  bolus 1,000 mL (0 mLs Intravenous Stopped 10/29/23 2009)  fosfomycin (MONUROL) packet 3 g (3 g Oral Given 10/29/23 1906)  potassium chloride SA (KLOR-CON M) CR tablet 40 mEq (40 mEq Oral Given 10/29/23 1906)  magnesium oxide (MAG-OX) tablet 800 mg (800 mg Oral Given 10/29/23 1906)  potassium chloride 10 mEq in 100 mL IVPB (0 mEq Intravenous Stopped 10/29/23 2006)                                                                                                                                     Procedures Procedures  (including critical care time)  Medical Decision Making / ED Course   This patient presents to the ED for concern of fatigue,  generalized weakness, this involves an extensive number of treatment options, and is a complaint that carries with it a high risk of complications and morbidity.  The differential diagnosis includes polysubstance use, substance withdrawal, electrolyte abnormality, dehydration, anemia  MDM: Patient seen emergency room for evaluation of generalized somnolence and fatigue.  Physical exam reveals a minimally somnolent patient who is alert, awake and answering questions but feels drowsy.  Physical exam otherwise  unremarkable.  Laboratory evaluation with no significant leukocytosis, likely symptomatic hyponatremia at 126, potassium 2.7, creatinine 1.35.  UDS positive for THC.  Urinalysis with moderate leuk esterase, 21-50 white blood cells and rare bacteria and patient treated with fosfomycin.  Electrolytes repleted in the emergency department and on reevaluation, patient stating that she would like to be discharged.  I did have a discussion with her about her electrolyte derangements and that if she is to leave the emergency department she needs to follow with her primary care physician to have these closely rechecked.  She was understanding of this.  Do suspect the patient's presentation today is multifactorial in the setting of Klonopin use, marijuana use and led to a edible use and likely symptomatic hyponatremia.  She was given very strict return precautions of which she voiced understanding she was discharged at patient request.   Additional history obtained: -Additional history obtained from daughter -External records from outside source obtained and reviewed including: Chart review including previous notes, labs, imaging, consultation notes   Lab Tests: -I ordered, reviewed, and interpreted labs.   The pertinent results include:   Labs Reviewed  COMPREHENSIVE METABOLIC PANEL WITH GFR - Abnormal; Notable for the following components:      Result Value   Sodium 126 (*)    Potassium 2.7 (*)     Chloride 95 (*)    CO2 20 (*)    Glucose, Bld 112 (*)    Creatinine, Ser 1.35 (*)    Calcium 8.8 (*)    GFR, Estimated 43 (*)    All other components within normal limits  URINALYSIS, ROUTINE W REFLEX MICROSCOPIC - Abnormal; Notable for the following components:   Leukocytes,Ua MODERATE (*)    Bacteria, UA RARE (*)    All other components within normal limits  RAPID URINE DRUG SCREEN, HOSP PERFORMED - Abnormal; Notable for the following components:   Tetrahydrocannabinol POSITIVE (*)    All other components within normal limits  CBG MONITORING, ED - Abnormal; Notable for the following components:   Glucose-Capillary 118 (*)    All other components within normal limits  URINE CULTURE  CBC  ETHANOL      EKG   EKG Interpretation Date/Time:  Tuesday October 29 2023 17:18:14 EDT Ventricular Rate:  113 PR Interval:  160 QRS Duration:  99 QT Interval:  360 QTC Calculation: 494 R Axis:   59  Text Interpretation: Sinus tachycardia Confirmed by Jerzi Tigert ([id number]) on 10/30/2023 1:00:59 AM         Medicines ordered and prescription drug management: Meds ordered this encounter  Medications   lactated ringers  bolus 1,000 mL   fosfomycin (MONUROL) packet 3 g   potassium chloride SA (KLOR-CON M) CR tablet 40 mEq   magnesium oxide (MAG-OX) tablet 800 mg   potassium chloride 10 mEq in 100 mL IVPB   potassium chloride SA (KLOR-CON M) 20 MEQ tablet    Sig: Take 1 tablet (20 mEq total) by mouth 2 (two) times daily for 7 days.    Dispense:  14 tablet    Refill:  0    -I have reviewed the patients home medicines and have made adjustments as needed  Critical interventions none   Cardiac Monitoring: The patient was maintained on a cardiac monitor.  I personally viewed and interpreted the cardiac monitored which showed an underlying rhythm of: Sinus tachycardia  Social Determinants of Health:  Factors impacting patients care include: Polysubstance abuse   Reevaluation: After  the interventions noted  above, I reevaluated the patient and found that they have :improved  Co morbidities that complicate the patient evaluation  Past Medical History:  Diagnosis Date   Anxiety    Arthritis    Back pain    Chronic shoulder pain    Colon adenomas SEP 2016   GERD (gastroesophageal reflux disease)    Helicobacter pylori gastritis SEP 2016   History of hypertension    non since she stopped drinking 2012; off all meds   History of kidney stones    Knee pain, bilateral    Neuropathy    Rotator cuff syndrome of left shoulder       Dispostion: I considered admission for this patient, and using shared decision making, patient would like to be discharged with outpatient follow-up and thus she was discharged at patient request     Final Clinical Impression(s) / ED Diagnoses Final diagnoses:  Generalized weakness  Polysubstance abuse (HCC)  Hypokalemia     @PCDICTATION @    Karlyn Overman, MD 10/30/23 0101

## 2023-10-29 NOTE — ED Triage Notes (Signed)
 Pt from home via [facility] c/o generalized weakness,difficulty ambulating, and periods of confusion.  Pt reports that she took a couple klonopin and states " It wasn't that much". She also reported that she shared a joint we several people. She is alert and oriented however, appears drowsy. Kellogg RN

## 2023-10-29 NOTE — ED Notes (Signed)
 ED Provider at bedside.

## 2023-10-29 NOTE — ED Notes (Signed)
 Pt reported to EDP that she took THC gummies. Kellogg RN

## 2023-10-30 LAB — URINE CULTURE: Culture: NO GROWTH

## 2024-04-02 ENCOUNTER — Ambulatory Visit: Admitting: Orthopedic Surgery
